# Patient Record
Sex: Female | Born: 1975
Health system: Southern US, Community
[De-identification: ages and names within clinical notes are randomized; demographics above are authoritative.]

## PROBLEM LIST (undated history)

## (undated) ENCOUNTER — Inpatient Hospital Stay (HOSPITAL_COMMUNITY): Payer: Self-pay

## (undated) DIAGNOSIS — E061 Subacute thyroiditis: Secondary | ICD-10-CM

## (undated) DIAGNOSIS — F419 Anxiety disorder, unspecified: Secondary | ICD-10-CM

## (undated) DIAGNOSIS — D649 Anemia, unspecified: Secondary | ICD-10-CM

## (undated) DIAGNOSIS — R112 Nausea with vomiting, unspecified: Secondary | ICD-10-CM

## (undated) DIAGNOSIS — F32A Depression, unspecified: Secondary | ICD-10-CM

## (undated) DIAGNOSIS — F329 Major depressive disorder, single episode, unspecified: Secondary | ICD-10-CM

## (undated) DIAGNOSIS — E0590 Thyrotoxicosis, unspecified without thyrotoxic crisis or storm: Secondary | ICD-10-CM

## (undated) DIAGNOSIS — R7611 Nonspecific reaction to tuberculin skin test without active tuberculosis: Secondary | ICD-10-CM

## (undated) DIAGNOSIS — Z9889 Other specified postprocedural states: Secondary | ICD-10-CM

## (undated) DIAGNOSIS — F410 Panic disorder [episodic paroxysmal anxiety] without agoraphobia: Secondary | ICD-10-CM

## (undated) DIAGNOSIS — E079 Disorder of thyroid, unspecified: Secondary | ICD-10-CM

## (undated) HISTORY — DX: Nonspecific reaction to tuberculin skin test without active tuberculosis: R76.11

## (undated) HISTORY — PX: LEEP: SHX91

## (undated) HISTORY — DX: Subacute thyroiditis: E06.1

## (undated) HISTORY — DX: Panic disorder (episodic paroxysmal anxiety): F41.0

## (undated) HISTORY — DX: Disorder of thyroid, unspecified: E07.9

---
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
## 1996-02-28 HISTORY — PX: OTHER SURGICAL HISTORY: SHX169

## 2001-10-23 ENCOUNTER — Emergency Department (HOSPITAL_COMMUNITY): Admission: EM | Admit: 2001-10-23 | Discharge: 2001-10-24 | Payer: Self-pay | Admitting: *Deleted

## 2005-02-16 ENCOUNTER — Other Ambulatory Visit: Admission: RE | Admit: 2005-02-16 | Discharge: 2005-02-16 | Payer: Self-pay | Admitting: Obstetrics and Gynecology

## 2007-02-28 DIAGNOSIS — E061 Subacute thyroiditis: Secondary | ICD-10-CM

## 2007-02-28 HISTORY — DX: Subacute thyroiditis: E06.1

## 2007-10-08 ENCOUNTER — Encounter: Admission: RE | Admit: 2007-10-08 | Discharge: 2007-10-08 | Payer: Self-pay | Admitting: Family Medicine

## 2008-01-16 ENCOUNTER — Encounter: Payer: Self-pay | Admitting: Family Medicine

## 2008-05-15 ENCOUNTER — Encounter: Payer: Self-pay | Admitting: Family Medicine

## 2008-05-15 LAB — CONVERTED CEMR LAB
Free T4: 0.84 ng/dL
TSH: 1.28 microintl units/mL

## 2009-05-28 ENCOUNTER — Encounter: Payer: Self-pay | Admitting: Family Medicine

## 2009-05-28 LAB — CONVERTED CEMR LAB: TSH: 1.33 microintl units/mL

## 2009-11-27 ENCOUNTER — Encounter: Payer: Self-pay | Admitting: Family Medicine

## 2010-03-31 ENCOUNTER — Ambulatory Visit: Payer: Self-pay | Admitting: Family Medicine

## 2010-04-01 ENCOUNTER — Encounter: Payer: Self-pay | Admitting: Family Medicine

## 2010-04-05 ENCOUNTER — Ambulatory Visit: Payer: Self-pay | Admitting: Family Medicine

## 2010-04-11 ENCOUNTER — Ambulatory Visit (INDEPENDENT_AMBULATORY_CARE_PROVIDER_SITE_OTHER): Payer: 59 | Admitting: Family Medicine

## 2010-04-11 ENCOUNTER — Encounter: Payer: Self-pay | Admitting: Family Medicine

## 2010-04-11 DIAGNOSIS — F410 Panic disorder [episodic paroxysmal anxiety] without agoraphobia: Secondary | ICD-10-CM | POA: Insufficient documentation

## 2010-04-11 DIAGNOSIS — E079 Disorder of thyroid, unspecified: Secondary | ICD-10-CM | POA: Insufficient documentation

## 2010-04-11 DIAGNOSIS — F411 Generalized anxiety disorder: Secondary | ICD-10-CM | POA: Insufficient documentation

## 2010-04-15 ENCOUNTER — Encounter: Payer: Self-pay | Admitting: Family Medicine

## 2010-04-18 ENCOUNTER — Ambulatory Visit: Payer: 59 | Admitting: Family Medicine

## 2010-04-18 ENCOUNTER — Encounter: Payer: Self-pay | Admitting: Family Medicine

## 2010-04-18 LAB — CONVERTED CEMR LAB: Pap Smear: ABNORMAL

## 2010-04-19 ENCOUNTER — Encounter: Payer: Self-pay | Admitting: Family Medicine
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
## 2010-04-20 NOTE — Miscellaneous (Signed)
  Clinical Lists Changes  Problems: Added new problem of PANIC DISORDER WITHOUT AGORAPHOBIA (ICD-300.01) Removed problem of PANIC ATTACK (ICD-300.01) Added new problem of ANXIETY DISORDER (ICD-300.00)

## 2010-04-26 ENCOUNTER — Encounter: Payer: Self-pay | Admitting: Family Medicine

## 2010-04-26 ENCOUNTER — Ambulatory Visit (INDEPENDENT_AMBULATORY_CARE_PROVIDER_SITE_OTHER): Payer: 59 | Admitting: Family Medicine

## 2010-04-26 DIAGNOSIS — F411 Generalized anxiety disorder: Secondary | ICD-10-CM

## 2010-04-26 NOTE — Letter (Signed)
Summary: [facility]   [facility]   Imported By: Kassie Mends 04/19/2010 08:46:35  _____________________________________________________________________  External Attachment:    Type:   Image     Comment:   External Document

## 2010-04-27 ENCOUNTER — Encounter: Payer: Self-pay | Admitting: Family Medicine

## 2010-04-28 ENCOUNTER — Telehealth: Payer: Self-pay | Admitting: Family Medicine

## 2010-05-02 ENCOUNTER — Telehealth: Payer: Self-pay | Admitting: Family Medicine

## 2010-05-05 NOTE — Miscellaneous (Signed)
  Clinical Lists Changes  Observations: Added new observation of PAST MED HX: * POSITIVE TB SKIN TEST-age 35, was treated with INH for 6 months PANIC ATTACK (ICD-300.01) THYROID DISORDER (ICD-246.9)- per Dr. Sharl Ma, no meds.   Gyn- Dr. Algie Coffer, G1 P0  Subacute thyroiditis - 2009 (04/27/2010 15:22) Added new observation of TSH: 1.33 microintl units/mL (05/28/2009 15:22) Added new observation of T4, FREE: 0.75 ng/dL (56/21/3086 57:84) Added new observation of TSH: 1.28 microintl units/mL (05/15/2008 15:22) Added new observation of T4, FREE: 0.84 ng/dL (69/62/9528 41:32) Added new observation of TSH: 2.53 microintl units/mL (01/16/2008 15:22) Added new observation of T4, FREE: 0.74 ng/dL (44/02/270 53:66)      Past History:  Past Medical History: * POSITIVE TB SKIN TEST-age 35, was treated with INH for 6 months PANIC ATTACK (ICD-300.01) THYROID DISORDER (ICD-246.9)- per Dr. Sharl Ma, no meds.   Gyn- Dr. Algie Coffer, G1 P0  Subacute thyroiditis - 2009

## 2010-05-05 NOTE — Progress Notes (Signed)
  Phone Note Outgoing Call   Summary of Call: I called patient.  she could continue B6 100mg  a day.  this may help her symptoms.  She has tried it one cycle.  I would give it more time.  If that doesn't work we can talk about other options.  She was thinking about accupuncture.  I am not aware of evidence for this but she'll look into it.  If her symptoms continue, we could consider a trial of another SSRI, ie low dose of prozac.  She'll notify us as needed.  Crawford Givens MD  April 28, 2010 3:12 PM

## 2010-05-05 NOTE — Assessment & Plan Note (Signed)
Summary: ANXIETY/CLE   [facility]   Vital Signs:  Patient profile:   35 year old female Height:      68 inches Weight:      250.50 pounds BMI:     38.23 Temp:     98.2 degrees F oral Pulse rate:   80 / minute Pulse rhythm:   regular BP sitting:   122 / 74  (left arm) Cuff size:   large  Vitals Entered By: [name] CMA Mary [name]) (April 26, 2010 3:26 PM) CC: Anxiety   History of Present Illness: Was trying to taper off the xanax.  She has had trouble with PMS symptoms. This is consistent before each menses.  She is asking about what can be done before each menses.  "I've always had trouble with PMS but I think it is getting worse."  Intolerant of zoloft prev.  Would like to try to get pregnant and isn't currently on OCPs.   goal for treatment is decrease in PMS symptoms wiht the least amount of medicine possible.  No SI/HI.   Allergies: 1)  ! Sulfa 2)  ! Penicillin 3)  ! Zoloft  Review of Systems       See HPI.  Otherwise negative.    Physical Exam  General:  no apparent distress pleasant in conversation mucous membranes moist regular rate and rhythm clear to auscultation bilaterally ext well perfused.    Impression & Recommendations:  Problem # 1:  ANXIETY DISORDER (ICD-300.00) I told her that I would like to consider several options and then get back in touch with her.  She agrees.  She was prev intolerant of zoloft. No change in meds in the meantime.  Continue with counseling.   Her updated medication list for this problem includes:    Alprazolam 0.25 Mg Tabs (Alprazolam) .Marland Kitchen... Take 1 tablet by mouth 4 to 6 times per day  Complete Medication List: 1)  Alprazolam 0.25 Mg Tabs (Alprazolam) .... Take 1 tablet by mouth 4 to 6 times per day 2)  B-1 High Potency 100 Mg Tabs (Thiamine hcl) .... Take 1 tablet by mouth once a day 3)  Fish Oil Oil (Fish oil) .Marland Kitchen.. 1200 mg. once daily   Patient Instructions: 1)  I'll call you with options.  Don't change your meds in the  meantime.    Orders Added: 1)  Est. Patient Level III [14782]    Current Allergies (reviewed today): ! SULFA ! PENICILLIN ! ZOLOFT

## 2010-05-05 NOTE — Miscellaneous (Signed)
  Clinical Lists Changes  Observations: Added new observation of PAP SMEAR: abnormal - LSIL (04/18/2010 16:02) Added new observation of TD BOOSTER: Tdap (09/17/2009 16:03)      Preventive Care Screening  Pap Smear:    Date:  04/18/2010    Results:  abnormal - LSIL    Immunization History:  Tetanus/Td Immunization History:    Tetanus/Td:  tdap (09/17/2009)

## 2010-05-09 ENCOUNTER — Telehealth: Payer: Self-pay | Admitting: Family Medicine

## 2010-05-10 ENCOUNTER — Ambulatory Visit: Payer: 59 | Admitting: Family Medicine

## 2010-05-10 NOTE — Progress Notes (Signed)
Summary: prozac   Phone Note Call from Patient   Caller: Patient Call For: Crawford Givens MD Summary of Call: Patient says that she has decided that she wants to try the prozac and  is asking if [facility] [address]. Initial call taken by: Melody Comas,  May 02, 2010 4:48 PM  Follow-up for Phone Call        please call in prozac 10mg  by mouth once daily.  #30, 2rf.  If patient has any adverse effect- increase in anxiety, worsening mood, etc, then stop the medicine and notify the clinic.  If she isn't having any trouble on the medicine, I still want her to call back with an update in 2 weeks.  thanks.  please update the med list.  Follow-up by: Crawford Givens MD,  May 02, 2010 5:15 PM  Additional Follow-up for Phone Call Additional follow up Details #1::        Patient Advised. Medication phoned to pharmacy.  Additional Follow-up by: [name] CMA (AAMA),  May 02, 2010 5:22 PM    New/Updated Medications: PROZAC 10 MG CAPS (FLUOXETINE HCL) Take 1 tablet by mouth once a day Prescriptions: PROZAC 10 MG CAPS (FLUOXETINE HCL) Take 1 tablet by mouth once a day  #30 x 2   Entered by:   [name] CMA (AAMA)   Authorized by:   Crawford Givens MD   Signed by:   [name] CMA (AAMA) on 05/02/2010   Method used:   Electronically to        [facility] [id number] ([facility]       [address]       [place], [place]  [address]       Ph: [phone] or [phone]       Fax: [phone]   RxID:   [id number]

## 2010-05-10 NOTE — Letter (Signed)
Summary: [facility]  [facility]   Imported By: Kassie Mends 05/05/2010 11:28:40  _____________________________________________________________________  External Attachment:    Type:   Image     Comment:   External Document

## 2010-05-10 NOTE — Letter (Signed)
Summary: [facility]   Imported By: Kassie Mends 05/05/2010 11:30:30  _____________________________________________________________________  External Attachment:    Type:   Image     Comment:   External Document

## 2010-05-17 NOTE — Progress Notes (Signed)
Summary: alprazolam   Phone Note Refill Request Message from:  Fax from Pharmacy on May 09, 2010 10:29 AM  Refills Requested: Medication #1:  ALPRAZOLAM 0.25 MG TABS Take 1 tablet by mouth 4 to 6 times per day   Last Refilled: 04/11/2010 [facility] from [facility] [address] [phone].  Initial call taken by: Melody Comas,  May 09, 2010 10:31 AM  Follow-up for Phone Call        please call in.  thanks. Crawford Givens MD  May 09, 2010 1:47 PM   Medication phoned to pharmacy. Lugene Fuquay CMA (AAMA)  May 09, 2010 2:28 PM     Prescriptions: ALPRAZOLAM 0.25 MG TABS (ALPRAZOLAM) Take 1 tablet by mouth 4 to 6 times per day  #130 x 0   Entered and Authorized by:   Crawford Givens MD   Signed by:   Crawford Givens MD on 05/09/2010   Method used:   Telephoned to ...       [facility] [address] [id number]       [address]       [place], [place]  [address]       Ph: [phone] or [phone]       Fax: [phone]   RxID:   [id number]

## 2010-05-26 ENCOUNTER — Telehealth: Payer: Self-pay | Admitting: *Deleted

## 2010-05-26 DIAGNOSIS — F32A Depression, unspecified: Secondary | ICD-10-CM

## 2010-05-26 DIAGNOSIS — F329 Major depressive disorder, single episode, unspecified: Secondary | ICD-10-CM

## 2010-05-26 MED ORDER — FLUOXETINE HCL 20 MG PO CAPS: 20.0000 mg | ORAL_CAPSULE | Freq: Every day | ORAL | Status: DC

## 2010-05-26 NOTE — Telephone Encounter (Signed)
Pt's therapist thinks that pt would benefit from an increased dose of prozac.  She is taking 10 mg's now, and pt is showing some improvement with that, but therapist thinks that she would do better with a higher dose. Wants to go to 20 mg's daily.  Please advise.  Pt's phone is [phone].

## 2010-05-26 NOTE — Telephone Encounter (Signed)
I would inc to 2 pills a day (total of 20mg  per day).  She can take both at the same time.  Please have her call with an update in ~ 2 weeks.  Thanks.

## 2010-05-26 NOTE — Telephone Encounter (Signed)
Patient advised.  New Rx. Sent to pharmacy.

## 2010-06-07 ENCOUNTER — Other Ambulatory Visit: Payer: Self-pay | Admitting: *Deleted

## 2010-06-08 MED ORDER — ALPRAZOLAM 0.25 MG PO TABS: ORAL_TABLET | ORAL | Status: DC

## 2010-06-08 NOTE — Telephone Encounter (Signed)
Please call in and please call patient to get an update on her symptoms, progress with therapy.  Thanks.

## 2010-06-08 NOTE — Telephone Encounter (Signed)
Patient says she is taking Prozac twice a day now and that seems to be helping.  She has cut back on the Xanax to about 3 to 3-1/2 per day and plans to taper off even more.  Medication phoned to pharmacy.

## 2010-06-08 NOTE — Telephone Encounter (Signed)
Noted  

## 2010-07-10 ENCOUNTER — Inpatient Hospital Stay (HOSPITAL_COMMUNITY)
Admission: AD | Admit: 2010-07-10 | Discharge: 2010-07-10 | Disposition: A | Discharge status: Home / Self Care | Payer: 59 | Source: Ambulatory Visit | Attending: Obstetrics & Gynecology | Admitting: Obstetrics & Gynecology

## 2010-07-10 DIAGNOSIS — N920 Excessive and frequent menstruation with regular cycle: Secondary | ICD-10-CM | POA: Insufficient documentation

## 2010-07-14 ENCOUNTER — Other Ambulatory Visit: Payer: Self-pay | Admitting: *Deleted

## 2010-07-15 MED ORDER — ALPRAZOLAM 0.25 MG PO TABS: ORAL_TABLET | ORAL | Status: DC

## 2010-07-15 NOTE — Telephone Encounter (Signed)
plz phone in and notify pt. 

## 2010-07-15 NOTE — Telephone Encounter (Signed)
Rx called in as directed.   

## 2010-07-28 ENCOUNTER — Telehealth: Payer: Self-pay | Admitting: Family Medicine

## 2010-07-28 DIAGNOSIS — F32A Depression, unspecified: Secondary | ICD-10-CM

## 2010-07-28 DIAGNOSIS — F329 Major depressive disorder, single episode, unspecified: Secondary | ICD-10-CM

## 2010-07-28 MED ORDER — FLUOXETINE HCL 40 MG PO CAPS: 40.0000 mg | ORAL_CAPSULE | Freq: Every day | ORAL | Status: DC

## 2010-07-28 NOTE — Telephone Encounter (Signed)
Please call pt.  Have her use the current prozac rx, but inc to a total of 30mg  a day for 1 week.  She can inc to 40mg  a day after that.  I sent in new rx for prozac 40mg .  Have her call back with an update on her condition in about 3-4 weeks, sooner if needed.  Thanks.

## 2010-07-29 NOTE — Telephone Encounter (Signed)
Patient notified and will call with an update.

## 2010-08-09 ENCOUNTER — Telehealth: Payer: Self-pay | Admitting: *Deleted

## 2010-08-09 NOTE — Telephone Encounter (Signed)
Pt's prozac dose was increased from 30 to 40 mg's and the increase is making her feel overly anxious.  She wants to go back down to 30 mg's and will need a new script sent to [facility] [address].  I advised pt that you are out of the office today.  She has enough to last a few more days.

## 2010-08-10 MED ORDER — FLUOXETINE HCL 10 MG PO TABS: 30.0000 mg | ORAL_TABLET | Freq: Every day | ORAL | Status: DC

## 2010-08-10 NOTE — Telephone Encounter (Signed)
Left message on voice mail  to call back

## 2010-08-10 NOTE — Telephone Encounter (Signed)
Rx was sent.  Please notify pt and then have her call back with update in 1-2 weeks, sooner if needed.  Thanks.

## 2010-08-10 NOTE — Telephone Encounter (Signed)
Patient notified as instructed by telephone. 

## 2010-08-15 ENCOUNTER — Other Ambulatory Visit: Payer: Self-pay | Admitting: *Deleted

## 2010-08-15 MED ORDER — ALPRAZOLAM 0.25 MG PO TABS: ORAL_TABLET | ORAL | Status: DC

## 2010-08-15 NOTE — Telephone Encounter (Signed)
Please call in

## 2010-08-15 NOTE — Telephone Encounter (Signed)
Rx called to pharmacy.  Patient notified via telephone.

## 2010-08-28 ENCOUNTER — Encounter: Payer: Self-pay | Admitting: Family Medicine

## 2010-09-13 ENCOUNTER — Other Ambulatory Visit: Payer: Self-pay | Admitting: *Deleted

## 2010-09-13 MED ORDER — ALPRAZOLAM 0.25 MG PO TABS: ORAL_TABLET | ORAL | Status: DC

## 2010-09-13 NOTE — Telephone Encounter (Signed)
Faxed request from [facility] [address], request is for # [phone].  Last filled 08/15/10.

## 2010-09-13 NOTE — Telephone Encounter (Signed)
Please call in

## 2010-09-14 ENCOUNTER — Other Ambulatory Visit: Payer: Self-pay | Admitting: *Deleted

## 2010-09-14 NOTE — Telephone Encounter (Signed)
Should have been called in yesterday, please check the prev records.

## 2010-09-14 NOTE — Telephone Encounter (Signed)
My error. It was taken care of. 

## 2010-09-14 NOTE — Telephone Encounter (Signed)
Okay to refill? 

## 2010-09-14 NOTE — Telephone Encounter (Signed)
Rx called in as directed.   

## 2010-10-17 ENCOUNTER — Other Ambulatory Visit: Payer: Self-pay | Admitting: *Deleted

## 2010-10-17 MED ORDER — ALPRAZOLAM 0.25 MG PO TABS: ORAL_TABLET | ORAL | Status: DC

## 2010-10-17 NOTE — Telephone Encounter (Signed)
Medication phoned to pharmacy.  Patient advised to schedule appt.

## 2010-10-17 NOTE — Telephone Encounter (Signed)
Please call in.  Please have her schedule a visit for the fall.  Thanks.

## 2010-11-14 ENCOUNTER — Ambulatory Visit: Payer: 59 | Admitting: Family Medicine

## 2010-11-18 ENCOUNTER — Encounter: Payer: Self-pay | Admitting: Family Medicine

## 2010-11-22 ENCOUNTER — Ambulatory Visit: Payer: 59 | Admitting: Family Medicine

## 2010-11-25 ENCOUNTER — Ambulatory Visit: Payer: 59 | Admitting: Family Medicine

## 2010-11-28 ENCOUNTER — Encounter: Payer: Self-pay | Admitting: Family Medicine

## 2010-11-28 ENCOUNTER — Ambulatory Visit (INDEPENDENT_AMBULATORY_CARE_PROVIDER_SITE_OTHER): Payer: BC Managed Care – PPO | Admitting: Family Medicine

## 2010-11-28 VITALS — BP 108/76 | HR 87 | Temp 98.4°F | Wt 247.1 lb

## 2010-11-28 DIAGNOSIS — S61219A Laceration without foreign body of unspecified finger without damage to nail, initial encounter: Secondary | ICD-10-CM

## 2010-11-28 DIAGNOSIS — F410 Panic disorder [episodic paroxysmal anxiety] without agoraphobia: Secondary | ICD-10-CM

## 2010-11-28 DIAGNOSIS — S61209A Unspecified open wound of unspecified finger without damage to nail, initial encounter: Secondary | ICD-10-CM

## 2010-11-28 MED ORDER — ALPRAZOLAM 0.25 MG PO TABS: 0.2500 mg | ORAL_TABLET | Freq: Two times a day (BID) | ORAL | Status: DC

## 2010-11-28 MED ORDER — FLUOXETINE HCL 10 MG PO CAPS: 30.0000 mg | ORAL_CAPSULE | Freq: Every day | ORAL | Status: DC

## 2010-11-28 NOTE — Progress Notes (Signed)
"  I feel much better.  The prozac really helped."  She has been on less xanax, now down to 2 a day.  Still in counseling with Dr. Leodis Binet.  She got rid of the old job and this helped a lot.  School full time at [employer], photography classes.  Doing well at home.  No SI/HI.  30mg  of prozac.  Still with relative inc in anxiety the week before her menses.  Exercising less with school; we talked about this today.  Sleep is okay.  She is motivated.    Flu shot declined by patient.    Recent laceration on L thumb, cleaned and bandaged.  Tetanus up to date.   Meds, vitals, and allergies reviewed.   ROS: See HPI.  Otherwise, noncontributory.  nad ncat rrr ctab Affect, speech, judgement wnl L thumb with healing distal laceration that doesn't involve the nailbed.  Good tissue approximation and it appears distally vascularly intact.  No erythema.  Re- bandage.

## 2010-11-28 NOTE — Assessment & Plan Note (Signed)
Appears to be healing well.  She may lose the flap eventually, but it's small and should still heal well.  D/w pt, she'll f/u prn.

## 2010-11-28 NOTE — Patient Instructions (Signed)
Continue to slowly taper off the xanax and continue with counseling.  I would talk with your OBGYN before pregnancy about your meds.  Let me know if you have concerns or if you can't taper off the xanax fully.  Take care.

## 2010-11-28 NOTE — Assessment & Plan Note (Addendum)
I think it would be okay to take prozac during pregnancy given the history of her symptoms, but it would good to get off xanax before conception.  She hopes to be off BZD by 12/12.  Continue with slow BZD taper for now and continue SSRI.  She appears to be doing well.  >25 min spent with face to face with patient, >50% counseling.

## 2011-02-27 ENCOUNTER — Other Ambulatory Visit: Payer: Self-pay | Admitting: Family Medicine

## 2011-02-27 ENCOUNTER — Other Ambulatory Visit: Payer: Self-pay | Admitting: *Deleted

## 2011-02-27 MED ORDER — FLUOXETINE HCL 10 MG PO CAPS: 30.0000 mg | ORAL_CAPSULE | Freq: Every day | ORAL | Status: DC

## 2011-03-01 ENCOUNTER — Other Ambulatory Visit: Payer: Self-pay | Admitting: *Deleted

## 2011-03-01 MED ORDER — ALPRAZOLAM 0.25 MG PO TABS: 0.2500 mg | ORAL_TABLET | Freq: Two times a day (BID) | ORAL | Status: DC

## 2011-03-01 NOTE — Telephone Encounter (Signed)
Please call in the alprazolam and talk to me about the prozac rx.  Thanks.

## 2011-03-01 NOTE — Telephone Encounter (Signed)
Patient's states she takes 40mg  of the Prozac for 3 months and 10mg (3 tabs daily= 30mg ) was faxed to her pharmacy

## 2011-03-01 NOTE — Telephone Encounter (Signed)
Faxed RF request.

## 2011-03-02 MED ORDER — FLUOXETINE HCL 10 MG PO CAPS: ORAL_CAPSULE | ORAL | Status: DC

## 2011-03-02 NOTE — Telephone Encounter (Addendum)
Medication phoned to pharmacy.  Patient contacted and she states that she was able to build up to 40 mg in an effort to lower her dosage of Alprazolam.  Dosage updated on meds list.

## 2011-09-04 ENCOUNTER — Other Ambulatory Visit: Payer: Self-pay | Admitting: *Deleted

## 2011-09-04 NOTE — Telephone Encounter (Signed)
Received faxed refill request from pharmacy for Fluoxetine 40 mg, take one capsule by mouth every day. This does not match the medication sheet, but total amount taken daily is correct.  Last office visit 11/28/10. Is it okay to refill medication?

## 2011-09-04 NOTE — Telephone Encounter (Signed)
Verify dose with patient and then then send 90 day supply with 1 rf.  Thanks.

## 2011-09-05 NOTE — Telephone Encounter (Signed)
Left message on machine to call back  

## 2011-09-05 NOTE — Telephone Encounter (Signed)
Pt called back, placed pt on hold to answer Dr Perrin's question and when I picked up pt had hung up. Left v/m for pt to call back.

## 2011-09-06 MED ORDER — FLUOXETINE HCL 40 MG PO CAPS: 40.0000 mg | ORAL_CAPSULE | Freq: Every day | ORAL | Status: DC

## 2011-09-06 NOTE — Telephone Encounter (Signed)
Spoke to patient and was advised that she is taking Fluoxetine 40 mg, once daily. Rx sent to pharmacy as instructed.

## 2011-12-07 ENCOUNTER — Other Ambulatory Visit: Payer: BC Managed Care – PPO

## 2011-12-12 LAB — OB RESULTS CONSOLE ANTIBODY SCREEN: Antibody Screen: NEGATIVE

## 2011-12-15 ENCOUNTER — Encounter: Payer: Self-pay | Admitting: Family Medicine

## 2011-12-15 ENCOUNTER — Ambulatory Visit (INDEPENDENT_AMBULATORY_CARE_PROVIDER_SITE_OTHER): Payer: BC Managed Care – PPO | Admitting: Family Medicine

## 2011-12-15 VITALS — BP 114/70 | HR 89 | Temp 98.8°F | Wt 268.8 lb

## 2011-12-15 DIAGNOSIS — F411 Generalized anxiety disorder: Secondary | ICD-10-CM

## 2011-12-15 DIAGNOSIS — E079 Disorder of thyroid, unspecified: Secondary | ICD-10-CM

## 2011-12-15 DIAGNOSIS — Z0000 Encounter for general adult medical examination without abnormal findings: Secondary | ICD-10-CM

## 2011-12-15 DIAGNOSIS — Z3490 Encounter for supervision of normal pregnancy, unspecified, unspecified trimester: Secondary | ICD-10-CM

## 2011-12-15 DIAGNOSIS — Z331 Pregnant state, incidental: Secondary | ICD-10-CM

## 2011-12-15 MED ORDER — FLUOXETINE HCL 40 MG PO CAPS: 40.0000 mg | ORAL_CAPSULE | Freq: Every day | ORAL | Status: DC

## 2011-12-15 MED ORDER — FLUOXETINE HCL 10 MG PO CAPS: 10.0000 mg | ORAL_CAPSULE | Freq: Every day | ORAL | Status: DC

## 2011-12-15 NOTE — Patient Instructions (Signed)
Let me know if you have concerns.   I would try to start waking at an easy pace.   I'll await your labs from the [facility] clinic.

## 2011-12-16 ENCOUNTER — Encounter: Payer: Self-pay | Admitting: Family Medicine

## 2011-12-16 DIAGNOSIS — Z3490 Encounter for supervision of normal pregnancy, unspecified, unspecified trimester: Secondary | ICD-10-CM | POA: Insufficient documentation

## 2011-12-16 DIAGNOSIS — Z131 Encounter for screening for diabetes mellitus: Secondary | ICD-10-CM | POA: Insufficient documentation

## 2011-12-16 NOTE — Progress Notes (Signed)
CPE- See plan.  Routine anticipatory guidance given to patient.  See health maintenance. Flu shot done 4 days ago.  Tetanus 2011 Diet and exercise discussed Pap per OBGYN Routine anticipatory guidance given.   Pregnant, G3P0.  Had a miscarriage earlier this year, kept up with counseling at that point.  Has f/u with OB, recently with pap and labs drawn.  [redacted] weeks pregnant.  Some AM sickness, resolving.  D/w pt about modest/moderate exercise during pregnancy.  She has talked with OB about all of her meds, they approve per patient.  No VB, ctx, FM, LOF.  She'll deliver at [facility].  We discussed pregnancy plans, possible postpartum mood changes, etc.    H/o thyroiditis.  TSH reported wnl at [facility] office earlier this week.  Awaiting records.  No neck pain, mass, dysphagia.    Anxiety- controlled with current meds.  Not needing BZD on 50mg  of prozac.  No Si/Hi.  Safe at home, husband supportive, doing well at work (had changed jobs).  Still in counseling.   PMH and SH reviewed  Meds, vitals, and allergies reviewed.   ROS: See HPI.  Otherwise negative.    GEN: nad, alert and oriented HEENT: mucous membranes moist NECK: supple w/o LA, no TMG or tenderness CV: rrr. PULM: ctab, no inc wob ABD: soft, +bs EXT: no edema SKIN: no acute rash

## 2011-12-16 NOTE — Assessment & Plan Note (Signed)
Routine care, diet/exercise, folate, and postpartum discussion. Doing well.  Will defer to [facility] clinic.

## 2011-12-16 NOTE — Assessment & Plan Note (Signed)
Controlled with 50mg  prozac, doing well.  Still in counseling.  Affect bright and doing well with pregnancy.  We discussed post partum changes, and she'll be in touch prn, will continue with counseling.

## 2011-12-16 NOTE — Assessment & Plan Note (Signed)
With TSH recently wnl per patient and awaiting records. No goiter on exam.

## 2011-12-16 NOTE — Assessment & Plan Note (Signed)
Routine anticipatory guidance given to patient.  See health maintenance. Flu shot done 4 days ago.  Tetanus 2011 Diet and exercise discussed Pap per OBGYN Routine anticipatory guidance given.

## 2011-12-21 LAB — OB RESULTS CONSOLE GC/CHLAMYDIA: Gonorrhea: NEGATIVE

## 2011-12-25 ENCOUNTER — Encounter (HOSPITAL_COMMUNITY): Payer: Self-pay | Admitting: *Deleted

## 2011-12-25 ENCOUNTER — Inpatient Hospital Stay (HOSPITAL_COMMUNITY): Payer: BC Managed Care – PPO

## 2011-12-25 ENCOUNTER — Inpatient Hospital Stay (HOSPITAL_COMMUNITY)
Admission: AD | Admit: 2011-12-25 | Discharge: 2011-12-25 | Disposition: A | Discharge status: Home / Self Care | Payer: BC Managed Care – PPO | Source: Ambulatory Visit | Attending: Obstetrics & Gynecology | Admitting: Obstetrics & Gynecology

## 2011-12-25 DIAGNOSIS — O200 Threatened abortion: Secondary | ICD-10-CM | POA: Insufficient documentation

## 2011-12-25 DIAGNOSIS — O209 Hemorrhage in early pregnancy, unspecified: Secondary | ICD-10-CM

## 2011-12-25 HISTORY — DX: Other specified postprocedural states: Z98.890

## 2011-12-25 HISTORY — DX: Other specified postprocedural states: R11.2

## 2011-12-25 MED ORDER — HYDROCODONE-ACETAMINOPHEN 5-325 MG PO TABS
1.0000 | ORAL_TABLET | ORAL | Status: DC | PRN
  Administered 2011-12-25: 1 via ORAL
  Filled 2011-12-25: qty 1

## 2011-12-25 NOTE — MAU Note (Signed)
Pt reports started having bright red bleeding x 45 minutes, states did not having any pain with the bleeding. States she has had a "really bad headache" all day, also reports her left arm was hurting last pm and that this happens when she is anxious so she took a xanax but it did not seem to help like usual.
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
## 2011-12-26 ENCOUNTER — Encounter: Payer: Self-pay | Admitting: Family Medicine

## 2012-02-06 ENCOUNTER — Ambulatory Visit: Payer: BC Managed Care – PPO | Admitting: Family Medicine

## 2012-02-28 NOTE — L&D Delivery Note (Signed)
Operative Delivery Note At 2:19 AM a viable female was delivered via Vaginal, Vacuum (Extractor)- KIWI.  Presentation: vertex; Position: Right,, Occiput,, Anterior; Station: +2.  Verbal consent: obtained from patient.  Risks and benefits discussed in detail.  Risks include, but are not limited to the risks of anesthesia, bleeding, infection, damage to maternal tissues, fetal cephalhematoma, ICH.  There is also the risk of inability to effect vaginal delivery of the head, or shoulder dystocia that cannot be resolved by established maneuvers, leading to the need for emergency cesarean section. Decision made to proceed with attempt at vacuum and only continue if baby is moving down appropriately. Indications for VAVD is unsure fetal status. IUPC and FSE in place but fetal heart tones hypervariable, many times recording in the low 90's. Audible FH irregular and bradycardic. Pt had a similar pattern w/ hypervariable FH and prolonged decles about 3 hrs prior, which improved after terbutaline. Since then, pt allowed passive descent and 30 min pushing prior to vacuum attempt. Pt making progress in descent and pushing with good effort.   KIWI vacuum applied after full consent. Continuous downward traction over 3 pushes with single contraction and pop off. Some movement but retraction of head after contraction over. Decision made to try one additional attempt. KIWI removed between contraction. With next contraction, over 3 pushes and with continued down traction/ guidance, delivery of fetal head. KIWI removed, shoulders followed easily. Baby to peds after cord cut.   APGAR: 7, 8; weight .  pending Placenta status: ,intact .   Cord: 3 vessels with the following complications: .  Cord pH: 7.36  Anesthesia: Epidural  Instruments: KIWI Episiotomy: None Lacerations: 2nd degree;Periurethral Suture Repair: 3.0 vicryl rapide Est. Blood Loss (mL): 650  Mom to postpartum.  Baby to nursery-stable.  Evee Liska  A. 06/29/2012, 2:45 AM

## 2012-04-03 ENCOUNTER — Encounter (HOSPITAL_COMMUNITY): Payer: Self-pay | Admitting: *Deleted

## 2012-04-03 DIAGNOSIS — R059 Cough, unspecified: Secondary | ICD-10-CM | POA: Insufficient documentation

## 2012-04-03 DIAGNOSIS — R05 Cough: Secondary | ICD-10-CM | POA: Insufficient documentation

## 2012-04-03 DIAGNOSIS — O9989 Other specified diseases and conditions complicating pregnancy, childbirth and the puerperium: Secondary | ICD-10-CM | POA: Insufficient documentation

## 2012-04-03 DIAGNOSIS — R5381 Other malaise: Secondary | ICD-10-CM | POA: Insufficient documentation

## 2012-04-03 DIAGNOSIS — R5383 Other fatigue: Secondary | ICD-10-CM | POA: Insufficient documentation

## 2012-04-03 DIAGNOSIS — R509 Fever, unspecified: Secondary | ICD-10-CM | POA: Insufficient documentation

## 2012-04-03 NOTE — ED Notes (Signed)
Pt called to update vital signs, no answer X 2.

## 2012-04-03 NOTE — ED Notes (Signed)
Pt reports cold/flu like symptoms since this past Saturday. Reports productive cough with yellow sputum, chills, fever of 99.5, fatigue. Patient is [redacted] weeks pregnant and came for further evaluation due to being pregnant. Pt has taken tylenol for the pain and fever. Last dose was this am around 8am. Pt is afebrile at this time.

## 2012-04-04 ENCOUNTER — Telehealth: Payer: Self-pay | Admitting: Family Medicine

## 2012-04-04 ENCOUNTER — Encounter: Payer: Self-pay | Admitting: Family Medicine

## 2012-04-04 ENCOUNTER — Emergency Department (HOSPITAL_COMMUNITY)
Admission: EM | Admit: 2012-04-04 | Discharge: 2012-04-04 | Discharge status: Against Medical Advice | Payer: BC Managed Care – PPO | Attending: Emergency Medicine | Admitting: Emergency Medicine

## 2012-04-04 ENCOUNTER — Ambulatory Visit (INDEPENDENT_AMBULATORY_CARE_PROVIDER_SITE_OTHER): Payer: BC Managed Care – PPO | Admitting: Family Medicine

## 2012-04-04 VITALS — BP 118/72 | HR 96 | Temp 98.1°F | Wt 281.8 lb

## 2012-04-04 DIAGNOSIS — R05 Cough: Secondary | ICD-10-CM

## 2012-04-04 DIAGNOSIS — J069 Acute upper respiratory infection, unspecified: Secondary | ICD-10-CM

## 2012-04-04 DIAGNOSIS — R059 Cough, unspecified: Secondary | ICD-10-CM

## 2012-04-04 HISTORY — DX: Anxiety disorder, unspecified: F41.9

## 2012-04-04 LAB — POCT INFLUENZA A/B
Influenza A, POC: NEGATIVE
Influenza B, POC: NEGATIVE

## 2012-04-04 MED ORDER — AZITHROMYCIN 250 MG PO TABS: ORAL_TABLET | ORAL | Status: DC

## 2012-04-04 NOTE — Progress Notes (Signed)
Pregnant, no contractions.  26 weeks. +FM.  No LOF, no VB now (noted early in pregnancy, had been seen by OB GYN at the time and resolved).  Morning sickness is resolved now.  Safe at home.    Sx started about 1 week ago.  Sick contacts at work.  Had a flu shot prev (~9/13).  She had throat sx initially, then cough.  Then had more head congestion.  Cough is productive.  Waking from the cough.  No fevers, tmax 99.7.  No vomiting. Last night she had a change in her breathing (possibly from nasal congestion and inc in abdomen size during the pregnancy).  She's had some occ bloody nasal discharge.  She was worried about catching something else at the ER last night and went home.  Here today for eval.  Meds, vitals, and allergies reviewed.   ROS: See HPI.  Otherwise, noncontributory.  GEN: nad, alert and oriented HEENT: mucous membranes moist, tm w/o erythema, nasal exam w/o erythema, clear discharge noted,  OP with cobblestoning, sinuses not ttp x4 NECK: supple w/o LA CV: rrr.   PULM: ctab, no inc wob EXT: no edema SKIN: no acute rash

## 2012-04-04 NOTE — Telephone Encounter (Signed)
Call pt, get update on condition.  See MAU notes.

## 2012-04-04 NOTE — Telephone Encounter (Signed)
Patient scheduled for appt today

## 2012-04-04 NOTE — ED Notes (Signed)
Pt called for room X2. No answer.

## 2012-04-04 NOTE — Patient Instructions (Addendum)
Hold onto the antibiotics for now.  Start them if your symptoms persist longer than 7 days.

## 2012-04-05 ENCOUNTER — Encounter: Payer: Self-pay | Admitting: *Deleted

## 2012-04-05 DIAGNOSIS — J069 Acute upper respiratory infection, unspecified: Secondary | ICD-10-CM | POA: Insufficient documentation

## 2012-04-05 NOTE — Assessment & Plan Note (Signed)
Nontoxic, likely viral.  Flu neg.  She has more sinus pressure than pain.  We talked about options.  If she continues to have symptoms lasting more than 1 week without improvement, then it would be reasonable to put her on abx.  zmax is a B for pregnancy.  She agrees.  Will notify OB clinic as a FYI.  I doubt she'll need the abx.

## 2012-04-22 LAB — OB RESULTS CONSOLE RPR: RPR: NONREACTIVE

## 2012-06-06 ENCOUNTER — Encounter (HOSPITAL_COMMUNITY): Payer: Self-pay | Admitting: *Deleted

## 2012-06-06 ENCOUNTER — Inpatient Hospital Stay (HOSPITAL_COMMUNITY)
Admission: AD | Admit: 2012-06-06 | Discharge: 2012-06-06 | Disposition: A | Discharge status: Home / Self Care | Payer: BC Managed Care – PPO | Source: Ambulatory Visit | Attending: Obstetrics | Admitting: Obstetrics

## 2012-06-06 DIAGNOSIS — O4700 False labor before 37 completed weeks of gestation, unspecified trimester: Secondary | ICD-10-CM | POA: Insufficient documentation

## 2012-06-06 DIAGNOSIS — O99891 Other specified diseases and conditions complicating pregnancy: Secondary | ICD-10-CM | POA: Insufficient documentation

## 2012-06-06 LAB — POCT FERN TEST: POCT Fern Test: NEGATIVE
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
## 2012-06-06 NOTE — MAU Note (Signed)
Leaking clear fluid since 4 pm this afternoon. Now just feels like her underwear is wet, but states doesn't smell like urine. Denies vaginal bleeding. Positive fetal movement. Some braxton hicks contractions.

## 2012-06-13 LAB — OB RESULTS CONSOLE GBS: GBS: NEGATIVE

## 2012-06-27 ENCOUNTER — Other Ambulatory Visit: Payer: Self-pay | Admitting: Obstetrics

## 2012-06-28 ENCOUNTER — Encounter (HOSPITAL_COMMUNITY): Payer: Self-pay

## 2012-06-28 ENCOUNTER — Encounter (HOSPITAL_COMMUNITY): Payer: Self-pay | Admitting: Anesthesiology

## 2012-06-28 ENCOUNTER — Inpatient Hospital Stay (HOSPITAL_COMMUNITY)
Admission: AD | Admit: 2012-06-28 | Discharge: 2012-06-30 | DRG: 373 | Disposition: A | Discharge status: Home / Self Care | Payer: BC Managed Care – PPO | Source: Ambulatory Visit | Attending: Obstetrics | Admitting: Obstetrics

## 2012-06-28 ENCOUNTER — Inpatient Hospital Stay (HOSPITAL_COMMUNITY): Payer: BC Managed Care – PPO | Admitting: Anesthesiology

## 2012-06-28 DIAGNOSIS — O34599 Maternal care for other abnormalities of gravid uterus, unspecified trimester: Secondary | ICD-10-CM | POA: Diagnosis present

## 2012-06-28 DIAGNOSIS — D4959 Neoplasm of unspecified behavior of other genitourinary organ: Secondary | ICD-10-CM | POA: Diagnosis present

## 2012-06-28 DIAGNOSIS — O09529 Supervision of elderly multigravida, unspecified trimester: Secondary | ICD-10-CM | POA: Diagnosis present

## 2012-06-28 DIAGNOSIS — O4100X0 Oligohydramnios, unspecified trimester, not applicable or unspecified: Secondary | ICD-10-CM | POA: Diagnosis present

## 2012-06-28 DIAGNOSIS — D259 Leiomyoma of uterus, unspecified: Secondary | ICD-10-CM | POA: Diagnosis present

## 2012-06-28 DIAGNOSIS — D62 Acute posthemorrhagic anemia: Secondary | ICD-10-CM | POA: Diagnosis not present

## 2012-06-28 DIAGNOSIS — O9903 Anemia complicating the puerperium: Secondary | ICD-10-CM | POA: Diagnosis not present

## 2012-06-28 LAB — CBC
HCT: 32.9 % — ABNORMAL LOW (ref 36.0–46.0)
Hemoglobin: 11.1 g/dL — ABNORMAL LOW (ref 12.0–15.0)
MCH: 28.7 pg (ref 26.0–34.0)
MCHC: 33.7 g/dL (ref 30.0–36.0)
MCV: 85 fL (ref 78.0–100.0)
RBC: 3.87 MIL/uL (ref 3.87–5.11)

## 2012-06-28 LAB — OB RESULTS CONSOLE HIV ANTIBODY (ROUTINE TESTING): HIV: NONREACTIVE

## 2012-06-28 LAB — COMPREHENSIVE METABOLIC PANEL
ALT: 10 U/L (ref 0–35)
AST: 16 U/L (ref 0–37)
Albumin: 2.5 g/dL — ABNORMAL LOW (ref 3.5–5.2)
CO2: 20 mEq/L (ref 19–32)
Calcium: 10.4 mg/dL (ref 8.4–10.5)
Creatinine, Ser: 0.62 mg/dL (ref 0.50–1.10)
Sodium: 129 mEq/L — ABNORMAL LOW (ref 135–145)
Total Protein: 6.1 g/dL (ref 6.0–8.3)

## 2012-06-28 LAB — OB RESULTS CONSOLE HEPATITIS B SURFACE ANTIGEN: Hepatitis B Surface Ag: NEGATIVE

## 2012-06-28 LAB — ABO/RH: ABO/RH(D): O POS

## 2012-06-28 MED ORDER — EPHEDRINE 5 MG/ML INJ
10.0000 mg | INTRAVENOUS | Status: DC | PRN
  Filled 2012-06-28: qty 4
  Filled 2012-06-28: qty 2

## 2012-06-28 MED ORDER — OXYTOCIN BOLUS FROM INFUSION: 500.0000 mL | INTRAVENOUS | Status: DC

## 2012-06-28 MED ORDER — ACETAMINOPHEN 325 MG PO TABS: 650.0000 mg | ORAL_TABLET | ORAL | Status: DC | PRN

## 2012-06-28 MED ORDER — DIPHENHYDRAMINE HCL 50 MG/ML IJ SOLN
12.5000 mg | INTRAMUSCULAR | Status: DC | PRN
Start: 2012-06-28 — End: 2012-06-29

## 2012-06-28 MED ORDER — IBUPROFEN 600 MG PO TABS: 600.0000 mg | ORAL_TABLET | Freq: Four times a day (QID) | ORAL | Status: DC | PRN

## 2012-06-28 MED ORDER — ZOLPIDEM TARTRATE 5 MG PO TABS: 5.0000 mg | ORAL_TABLET | Freq: Every evening | ORAL | Status: DC | PRN

## 2012-06-28 MED ORDER — OXYTOCIN 40 UNITS IN LACTATED RINGERS INFUSION - SIMPLE MED: 1.0000 m[IU]/min | INTRAVENOUS | Status: DC

## 2012-06-28 MED ORDER — EPHEDRINE 5 MG/ML INJ
10.0000 mg | INTRAVENOUS | Status: DC | PRN
  Filled 2012-06-28: qty 2

## 2012-06-28 MED ORDER — FENTANYL 2.5 MCG/ML BUPIVACAINE 1/10 % EPIDURAL INFUSION (WH - ANES)
14.0000 mL/h | INTRAMUSCULAR | Status: DC | PRN
  Administered 2012-06-28: 14 mL/h via EPIDURAL
  Filled 2012-06-28: qty 125

## 2012-06-28 MED ORDER — PHENYLEPHRINE 40 MCG/ML (10ML) SYRINGE FOR IV PUSH (FOR BLOOD PRESSURE SUPPORT)
80.0000 ug | PREFILLED_SYRINGE | INTRAVENOUS | Status: DC | PRN
  Filled 2012-06-28: qty 2

## 2012-06-28 MED ORDER — LACTATED RINGERS IV SOLN
500.0000 mL | INTRAVENOUS | Status: DC | PRN
  Administered 2012-06-28: 1000 mL via INTRAVENOUS

## 2012-06-28 MED ORDER — MISOPROSTOL 25 MCG QUARTER TABLET: 25.0000 ug | ORAL_TABLET | ORAL | Status: DC | PRN

## 2012-06-28 MED ORDER — CITRIC ACID-SODIUM CITRATE 334-500 MG/5ML PO SOLN: 30.0000 mL | ORAL | Status: DC | PRN

## 2012-06-28 MED ORDER — LACTATED RINGERS IV SOLN
INTRAVENOUS | Status: DC
  Administered 2012-06-28: 16:00:00 via INTRAVENOUS

## 2012-06-28 MED ORDER — TERBUTALINE SULFATE 1 MG/ML IJ SOLN
INTRAMUSCULAR | Status: AC
  Administered 2012-06-28: 0.25 mg via SUBCUTANEOUS
  Filled 2012-06-28: qty 1

## 2012-06-28 MED ORDER — PHENYLEPHRINE 40 MCG/ML (10ML) SYRINGE FOR IV PUSH (FOR BLOOD PRESSURE SUPPORT)
80.0000 ug | PREFILLED_SYRINGE | INTRAVENOUS | Status: DC | PRN
  Administered 2012-06-28: 80 ug via INTRAVENOUS
  Filled 2012-06-28 (×2): qty 5
  Filled 2012-06-28: qty 2

## 2012-06-28 MED ORDER — ONDANSETRON HCL 4 MG/2ML IJ SOLN: 4.0000 mg | Freq: Four times a day (QID) | INTRAMUSCULAR | Status: DC | PRN

## 2012-06-28 MED ORDER — LIDOCAINE HCL (PF) 1 % IJ SOLN
30.0000 mL | INTRAMUSCULAR | Status: DC | PRN
  Filled 2012-06-28 (×2): qty 30

## 2012-06-28 MED ORDER — TERBUTALINE SULFATE 1 MG/ML IJ SOLN: 0.2500 mg | Freq: Once | INTRAMUSCULAR | Status: DC | PRN

## 2012-06-28 MED ORDER — LIDOCAINE HCL (PF) 1 % IJ SOLN
INTRAMUSCULAR | Status: DC | PRN
  Administered 2012-06-28 (×4): 4 mL

## 2012-06-28 MED ORDER — LACTATED RINGERS IV SOLN
500.0000 mL | Freq: Once | INTRAVENOUS | Status: AC
  Administered 2012-06-28: 500 mL via INTRAVENOUS

## 2012-06-28 MED ORDER — OXYTOCIN 40 UNITS IN LACTATED RINGERS INFUSION - SIMPLE MED
62.5000 mL/h | INTRAVENOUS | Status: DC
  Filled 2012-06-28: qty 1000

## 2012-06-28 MED ORDER — OXYCODONE-ACETAMINOPHEN 5-325 MG PO TABS: 1.0000 | ORAL_TABLET | ORAL | Status: DC | PRN

## 2012-06-28 NOTE — Anesthesia Preprocedure Evaluation (Signed)
Anesthesia Evaluation  Patient identified by MRN, date of birth, ID band Patient awake    Reviewed: Allergy & Precautions, H&P , NPO status , Patient's Chart, lab work & pertinent test results, reviewed documented beta blocker date and time   History of Anesthesia Complications (+) PONV  Airway Mallampati: II TM Distance: >3 FB Neck ROM: full    Dental  (+) Teeth Intact   Pulmonary neg pulmonary ROS,  +PPD treated breath sounds clear to auscultation        Cardiovascular negative cardio ROS  Rhythm:regular Rate:Normal     Neuro/Psych PSYCHIATRIC DISORDERS (anxiety, panic attacks) negative neurological ROS     GI/Hepatic negative GI ROS, Neg liver ROS,   Endo/Other  Morbid obesitySubacute thyroiditis  Renal/GU negative Renal ROS  negative genitourinary   Musculoskeletal   Abdominal   Peds  Hematology negative hematology ROS (+)   Anesthesia Other Findings   Reproductive/Obstetrics (+) Pregnancy                           Anesthesia Physical Anesthesia Plan  ASA: III  Anesthesia Plan: Epidural   Post-op Pain Management:    Induction:   Airway Management Planned:   Additional Equipment:   Intra-op Plan:   Post-operative Plan:   Informed Consent: I have reviewed the patients History and Physical, chart, labs and discussed the procedure including the risks, benefits and alternatives for the proposed anesthesia with the patient or authorized representative who has indicated his/her understanding and acceptance.     Plan Discussed with:   Anesthesia Plan Comments:         Anesthesia Quick Evaluation

## 2012-06-28 NOTE — Anesthesia Procedure Notes (Signed)
Epidural Patient location during procedure: OB Start time: 06/28/2012 5:55 PM  Staffing Performed by: anesthesiologist   Preanesthetic Checklist Completed: patient identified, site marked, surgical consent, pre-op evaluation, timeout performed, IV checked, risks and benefits discussed and monitors and equipment checked  Epidural Patient position: sitting Prep: site prepped and draped and DuraPrep Patient monitoring: continuous pulse ox and blood pressure Approach: midline Injection technique: LOR air  Needle:  Needle type: Tuohy  Needle gauge: 17 G Needle length: 9 cm and 9 Needle insertion depth: 7 cm Catheter type: closed end flexible Catheter size: 19 Gauge Catheter at skin depth: 12 cm Test dose: negative  Assessment Events: blood not aspirated, injection not painful, no injection resistance, negative IV test and no paresthesia  Additional Notes Discussed risk of headache, infection, bleeding, nerve injury and failed or incomplete block.  Patient voices understanding and wishes to proceed.  Epidural placed easily on first attempt.  No paresthesia.  Patient tolerated procedure well with no apparent complications. Jasmine December, MDReason for block:procedure for pain
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
## 2012-06-28 NOTE — Progress Notes (Signed)
S: Doing well, no complaints, pain well controlled with epidural  O: BP 128/71  Pulse 89  Temp(Src) 98.1 F (36.7 C) (Oral)  Resp 20  Ht 5' 8.5" (1.74 m)  Wt 131.543 kg (290 lb)  BMI 43.45 kg/m2  SpO2 99%  LMP 11/09/2010   FHT:  FHR: 140s bpm, variability: moderate,  accelerations:  Present,  decelerations:  Absent UC:   irregular, every 5-7 minutes SVE:   Dilation: 6 Effacement (%): 100 Station: -2 Exam by:: Ernestina Penna, MD AROM - thin meconium   A / P:  37 y.o.  Obstetric History   G3   P0   T0   P0   A2   TAB0   SAB2   E0   M0   L0    at [redacted]w[redacted]d Augmentation of labor, progressing well. Will follow progress now s/p AROM, if contractions do not pick up and cervix does not continue to change may need to augment with pitocin  Fetal Wellbeing:  Category I Pain Control:  Epidural  Anticipated MOD:  NSVD  Krystyna Cleckley A. 06/28/2012, 7:07 PM

## 2012-06-28 NOTE — Progress Notes (Signed)
S: Doing well, no complaints, pain well controlled with epidural  O: BP 126/67  Pulse 92  Temp(Src) 98.1 F (36.7 C) (Oral)  Resp 18  Ht 5' 8.5" (1.74 m)  Wt 131.543 kg (290 lb)  BMI 43.45 kg/m2  SpO2 99%  LMP 11/09/2010   FHT:  FHR: 140's bpm, variability: moderate,  accelerations:  Present,  decelerations:  Present single decel for 1 min with cervical exam UC:   irregular, every 2-5 minutes SVE:   Dilation: 5 Effacement (%): 100 Station: 0 Exam by:: Dr. Ernestina Penna   A / P:  37 y.o.  Obstetric History   G3   P0   T0   P0   A2   TAB0   SAB2   E0   M0   L0    at [redacted]w[redacted]d Augmentation of labor, progressing well, some change in descent since AROM but cvx regressed from 6 to 5 cm, unclear if true change or 6 cm was a generous assessment. 100%. Head now well applied. IUPC placed now, may need to start pitocin if contractions not adequate.   Fetal Wellbeing:  Category I Pain Control:  Epidural  Anticipated MOD:  NSVD  Mary [name] A. 06/28/2012, 8:50 PM

## 2012-06-29 ENCOUNTER — Encounter (HOSPITAL_COMMUNITY): Payer: Self-pay | Admitting: *Deleted

## 2012-06-29 LAB — RPR: RPR Ser Ql: NONREACTIVE

## 2012-06-29 MED ORDER — DIPHENHYDRAMINE HCL 25 MG PO CAPS: 25.0000 mg | ORAL_CAPSULE | Freq: Four times a day (QID) | ORAL | Status: DC | PRN

## 2012-06-29 MED ORDER — LANOLIN HYDROUS EX OINT: TOPICAL_OINTMENT | CUTANEOUS | Status: DC | PRN

## 2012-06-29 MED ORDER — ONDANSETRON HCL 4 MG/2ML IJ SOLN: 4.0000 mg | INTRAMUSCULAR | Status: DC | PRN

## 2012-06-29 MED ORDER — BENZOCAINE-MENTHOL 20-0.5 % EX AERO
1.0000 "application " | INHALATION_SPRAY | CUTANEOUS | Status: DC | PRN
  Administered 2012-06-29: 1 via TOPICAL
  Filled 2012-06-29: qty 56

## 2012-06-29 MED ORDER — IBUPROFEN 600 MG PO TABS
600.0000 mg | ORAL_TABLET | Freq: Four times a day (QID) | ORAL | Status: DC
  Administered 2012-06-29 – 2012-06-30 (×5): 600 mg via ORAL
  Filled 2012-06-29 (×5): qty 1

## 2012-06-29 MED ORDER — TETANUS-DIPHTH-ACELL PERTUSSIS 5-2.5-18.5 LF-MCG/0.5 IM SUSP: 0.5000 mL | Freq: Once | INTRAMUSCULAR | Status: DC

## 2012-06-29 MED ORDER — BISACODYL 10 MG RE SUPP: 10.0000 mg | Freq: Every day | RECTAL | Status: DC | PRN

## 2012-06-29 MED ORDER — OXYCODONE-ACETAMINOPHEN 5-325 MG PO TABS: 1.0000 | ORAL_TABLET | ORAL | Status: DC | PRN

## 2012-06-29 MED ORDER — ONDANSETRON HCL 4 MG PO TABS: 4.0000 mg | ORAL_TABLET | ORAL | Status: DC | PRN

## 2012-06-29 MED ORDER — FLUOXETINE HCL 20 MG PO CAPS
40.0000 mg | ORAL_CAPSULE | Freq: Every day | ORAL | Status: DC
  Administered 2012-06-29 – 2012-06-30 (×2): 40 mg via ORAL
  Filled 2012-06-29 (×2): qty 2

## 2012-06-29 MED ORDER — PRENATAL MULTIVITAMIN CH
1.0000 | ORAL_TABLET | Freq: Every day | ORAL | Status: DC
  Administered 2012-06-29: 1 via ORAL
  Filled 2012-06-29: qty 1

## 2012-06-29 MED ORDER — ZOLPIDEM TARTRATE 5 MG PO TABS: 5.0000 mg | ORAL_TABLET | Freq: Every evening | ORAL | Status: DC | PRN

## 2012-06-29 MED ORDER — SENNOSIDES-DOCUSATE SODIUM 8.6-50 MG PO TABS
2.0000 | ORAL_TABLET | Freq: Every day | ORAL | Status: DC
  Administered 2012-06-29: 2 via ORAL

## 2012-06-29 MED ORDER — SIMETHICONE 80 MG PO CHEW: 80.0000 mg | CHEWABLE_TABLET | ORAL | Status: DC | PRN

## 2012-06-29 MED ORDER — WITCH HAZEL-GLYCERIN EX PADS: 1.0000 "application " | MEDICATED_PAD | CUTANEOUS | Status: DC | PRN

## 2012-06-29 MED ORDER — FLEET ENEMA 7-19 GM/118ML RE ENEM: 1.0000 | ENEMA | Freq: Every day | RECTAL | Status: DC | PRN

## 2012-06-29 MED ORDER — DIBUCAINE 1 % RE OINT: 1.0000 "application " | TOPICAL_OINTMENT | RECTAL | Status: DC | PRN

## 2012-06-29 NOTE — Clinical Social Work Note (Signed)
CSW spoke with MOB about hx.  MOB reports she is doing fine, no current concerns and has been managed on Prozac medication.  Patient was referred for history of depression/anxiety.  * Referral screened out by Clinical Social Worker because none of the following criteria appear to apply: ~ History of anxiety/depression during this pregnancy, or of post-partum depression. ~ Diagnosis of anxiety and/or depression within last 3 years ~ History of depression due to pregnancy loss/loss of child  OR  * Patient's symptoms currently being treated with medication and/or therapy.  Please contact the Clinical Social Worker if needs arise, or by the patient's request.

## 2012-06-29 NOTE — Progress Notes (Signed)
PPD 0 SVD  S:  Reports feeling tired - no sleep yet             Tolerating po/ No nausea or vomiting             Bleeding is moderate             Pain controlled with motrin and percocet             Up ad lib / ambulatory / voiding QS  Newborn breast feeding  / female O:               VS: BP 126/74  Pulse 95  Temp(Src) 98.3 F (36.8 C) (Oral)  Resp 18  Ht 5' 8.5" (1.74 m)  Wt 131.543 kg (290 lb)  BMI 43.45 kg/m2  SpO2 100%  LMP 11/09/2010   LABS:  Recent Labs  06/28/12 1250  WBC 16.9*  HGB 11.1*  PLT 228                                       I&O: Intake/Output     05/02 0701 - 05/03 0700 05/03 0701 - 05/04 0700   Blood 650    Total Output 650     Net -650                      Physical Exam:             Alert and oriented X3  Lungs: Clear and unlabored  Heart: regular rate and rhythm / no mumurs  Abdomen: soft, non-tender, non-distended              Fundus: firm, non-tender, Ueven  Perineum: moderate edema / ice pack in place  Lochia: light  Extremities: 1+ pedal edema, no calf pain or tenderness    A: PPD # 0   Doing well - stable status  P:  Routine post partum orders    Marlinda Mike CNM, MSN 06/29/2012, 9:21 AM

## 2012-06-29 NOTE — Anesthesia Postprocedure Evaluation (Signed)
Anesthesia Post Note  Patient: Mary [name]  Procedure(s) Performed: * No procedures listed *  Anesthesia type: Epidural  Patient location: Mother/Baby  Post pain: Pain level controlled  Post assessment: Post-op Vital signs reviewed  Last Vitals: BP 126/74  Pulse 95  Temp(Src) 36.8 C (Oral)  Resp 18  Ht 5' 8.5" (1.74 m)  Wt 290 lb (131.543 kg)  BMI 43.45 kg/m2  SpO2 100%  LMP 11/09/2010  Post vital signs: Reviewed  Level of consciousness: awake  Complications: No apparent anesthesia complications

## 2012-06-30 LAB — CBC
Hemoglobin: 9 g/dL — ABNORMAL LOW (ref 12.0–15.0)
Platelets: 189 10*3/uL (ref 150–400)
RBC: 3.1 MIL/uL — ABNORMAL LOW (ref 3.87–5.11)
WBC: 16.4 10*3/uL — ABNORMAL HIGH (ref 4.0–10.5)

## 2012-06-30 MED ORDER — OXYCODONE-ACETAMINOPHEN 5-325 MG PO TABS: 1.0000 | ORAL_TABLET | ORAL | Status: DC | PRN

## 2012-06-30 MED ORDER — IBUPROFEN 600 MG PO TABS: 600.0000 mg | ORAL_TABLET | Freq: Four times a day (QID) | ORAL | Status: DC

## 2012-06-30 NOTE — Discharge Summary (Signed)
Obstetric Discharge Summary  Reason for Admission: onset of labor Prenatal Procedures: none Intrapartum Procedures: spontaneous vaginal delivery and vacuum Postpartum Procedures: none Complications-Operative and Postpartum: 2nd degree perineal laceration Hemoglobin  Date Value Range Status  06/30/2012 9.0* 12.0 - 15.0 g/dL Final     DELTA CHECK NOTED     REPEATED TO VERIFY     HCT  Date Value Range Status  06/30/2012 26.7* 36.0 - 46.0 % Final    Physical Exam:  General: alert, cooperative and no distress Lochia: appropriate Uterine Fundus: firm Incision: healing well DVT Evaluation: No evidence of DVT seen on physical exam.  Discharge Diagnoses: Term Pregnancy-delivered and ABL anemia - stable  Discharge Information: Date: 06/30/2012 Activity: pelvic rest Diet: routine Medications: PNV, Ibuprofen, Colace, Percocet and Prozac Condition: stable Instructions: refer to practice specific booklet Discharge to: home Follow-up Information   Follow up with Lendon Colonel., MD.   Contact information:   [address] [place] [address] [phone]       Schedule an appointment as soon as possible for a visit in 6 weeks to follow up.      Newborn Data: Live born female  Birth Weight: 6 lb 8.6 oz (2965 g) APGAR: 7, 8  Home with mother.  Marlinda Mike 06/30/2012, 10:21 AM

## 2012-06-30 NOTE — Progress Notes (Signed)
Post Partum Day 2 S/P forceps vaginal  Feeding: breast Subjective: No HA, SOB, CP, F/C, breast symptoms. Normal vaginal bleeding, no clots. Pain controlled.  ambulating without symptoms.  voiding without difficulty, no HA, no vision change  Objective: BP 114/7  Pulse 74  Temp(Src) 97.6 F (36.4 C) (Oral)  Resp 18  Ht 5' 8.5" (1.74 m)  Wt 131.543 kg (290 lb)  BMI 43.45 kg/m2  SpO2 97%  LMP 11/09/2010 I&O reviewed.   Physical Exam:  General: alert and cooperative Lochia: appropriate Uterine Fundus: firm DVT Evaluation: No evidence of DVT seen on physical exam. Ext: No c/c/e  Recent Labs  06/28/12 1250 06/30/12 0640  HGB 11.1* 9.0*  HCT 32.9* 26.7*      Assessment/Plan: 37 y.o.  PPD #2 .  normal postpartum exam Continue current postpartum care Discharge home   LOS: 2 days   Shella Lahman A. 06/30/2012 9:45 AM

## 2012-07-02 ENCOUNTER — Telehealth (HOSPITAL_COMMUNITY): Payer: Self-pay | Admitting: *Deleted

## 2012-07-02 NOTE — Telephone Encounter (Signed)
Preadmission screen  

## 2012-07-03 ENCOUNTER — Telehealth: Payer: Self-pay | Admitting: Family Medicine

## 2012-07-03 NOTE — Telephone Encounter (Signed)
Called and LMOVM for patient; she can call back as needed.  Was calling since she had recently delivered.

## 2012-07-09 ENCOUNTER — Inpatient Hospital Stay (HOSPITAL_COMMUNITY): Admission: RE | Admit: 2012-07-09 | Payer: BC Managed Care – PPO | Source: Ambulatory Visit

## 2012-11-18 ENCOUNTER — Other Ambulatory Visit: Payer: Self-pay | Admitting: Family Medicine

## 2012-11-18 NOTE — Telephone Encounter (Signed)
Electronic refill request.  Longer than 6 months since seen in the office.  Please advise.

## 2012-11-18 NOTE — Telephone Encounter (Signed)
Sent.  Please schedule patient.

## 2012-11-19 NOTE — Telephone Encounter (Signed)
Left detailed message on voicemail.  

## 2012-12-26 ENCOUNTER — Ambulatory Visit (INDEPENDENT_AMBULATORY_CARE_PROVIDER_SITE_OTHER): Payer: 59 | Admitting: Family Medicine

## 2012-12-26 ENCOUNTER — Encounter: Payer: Self-pay | Admitting: Family Medicine

## 2012-12-26 VITALS — BP 122/84 | HR 82 | Temp 98.7°F | Wt 256.2 lb

## 2012-12-26 DIAGNOSIS — Z23 Encounter for immunization: Secondary | ICD-10-CM

## 2012-12-26 DIAGNOSIS — F411 Generalized anxiety disorder: Secondary | ICD-10-CM

## 2012-12-26 MED ORDER — FLUOXETINE HCL 40 MG PO CAPS: ORAL_CAPSULE | ORAL | Status: DC

## 2012-12-26 MED ORDER — FLUOXETINE HCL 10 MG PO CAPS: ORAL_CAPSULE | ORAL | Status: DC

## 2012-12-26 NOTE — Progress Notes (Signed)
F/u for anxiety.  Doing well at home. Daughter is doing well.  Breastfeeding.  Staying at home with her.  "Manageable" stress level.  Doing well with meds. Mood is good.  No BZD use.  Getting some sleep.  Husband helping at home.  Meds, vitals, and allergies reviewed.   ROS: See HPI.  Otherwise, noncontributory.  nad ncat Mmm rrr ctab abd soft Ext w/o edema Speech and affect wnl, bright.

## 2012-12-26 NOTE — Assessment & Plan Note (Signed)
Doing well, continue as is with 50mg  prozac and f/u prn.  She agrees.

## 2012-12-26 NOTE — Patient Instructions (Signed)
Take care.  Don't change your meds. Call if you have concerns.  Glad to see you.

## 2012-12-27 ENCOUNTER — Other Ambulatory Visit: Payer: Self-pay | Admitting: Family Medicine

## 2013-11-17 IMAGING — US US OB TRANSVAGINAL
1 series · 14 of 28 positions shown · non-contrast
Comparison: None.

CLINICAL DATA: Pregnant patient with vaginal bleeding.

OBSTETRIC <14 WK US AND TRANSVAGINAL OB US
TECHNIQUE: Both transabdominal and transvaginal ultrasound
examinations were performed for complete evaluation of the
gestation as well as the maternal uterus, adnexal regions, and
pelvic cul-de-sac.  Transvaginal technique was performed to assess
early pregnancy.

[Series 1: us ob comp less 14 wks · 14 of 41 slices shown]
[im 2/41]
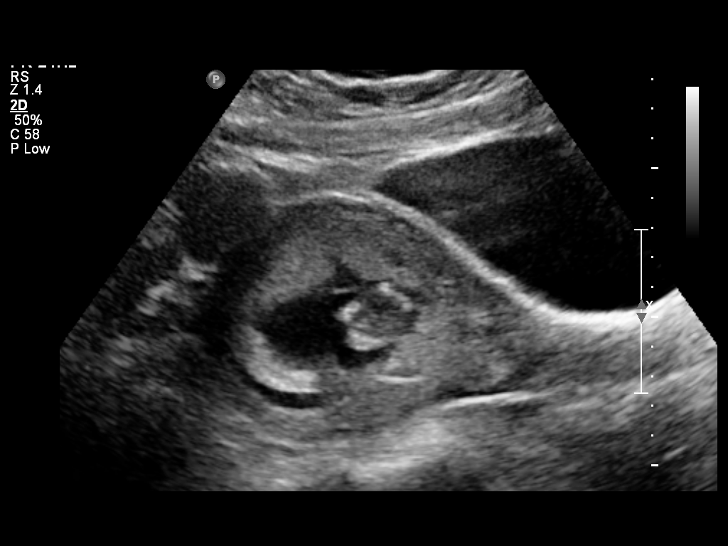
[im 5/41]
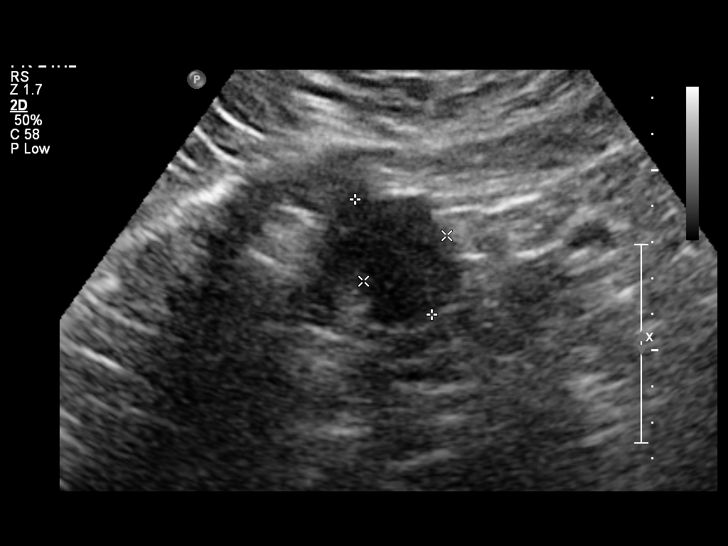
[im 8/41]
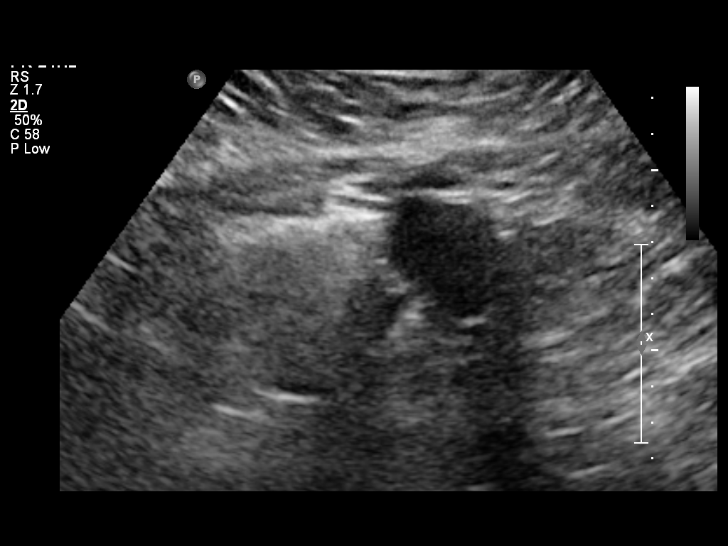
[im 11/41]
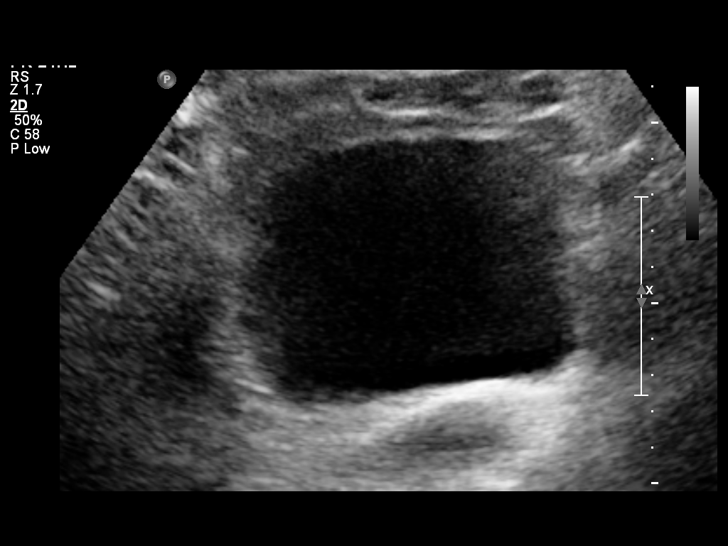
[im 14/41]
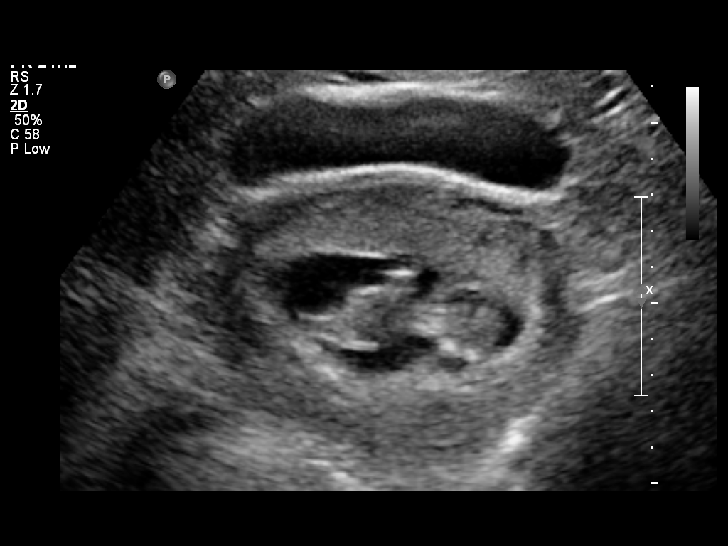
[im 17/41]
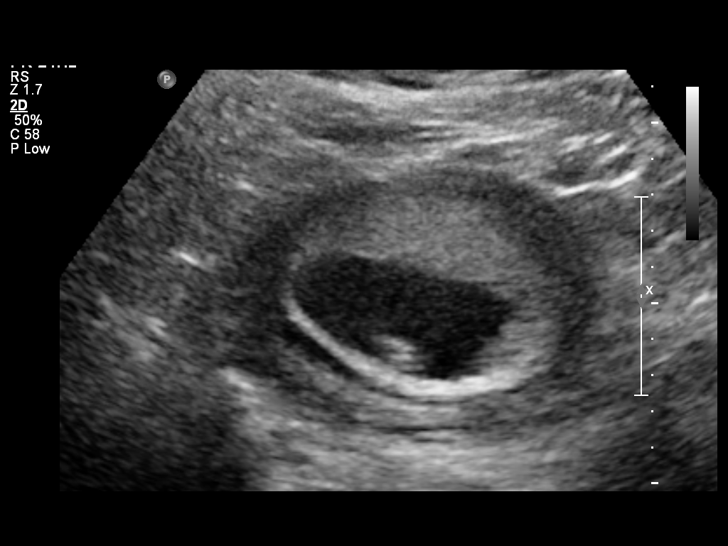
[im 20/41]
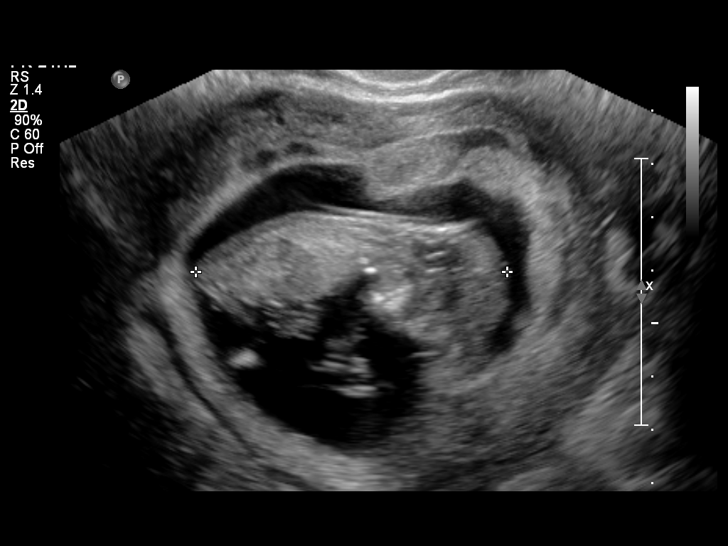
[im 23/41]
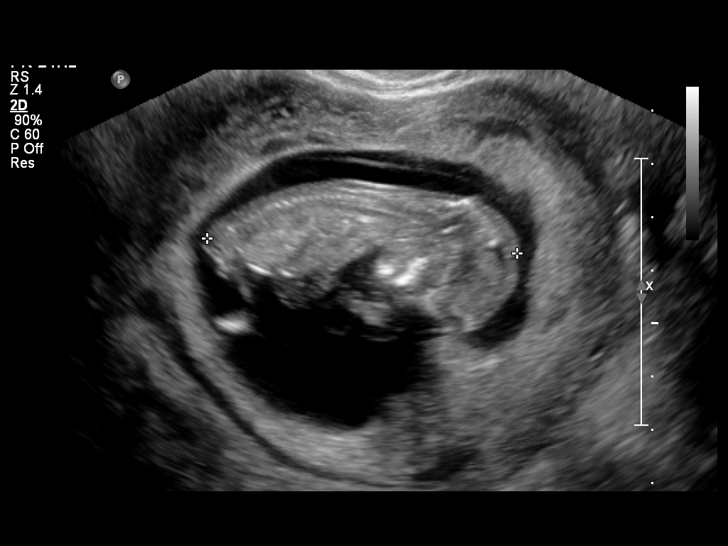
[im 26/41]
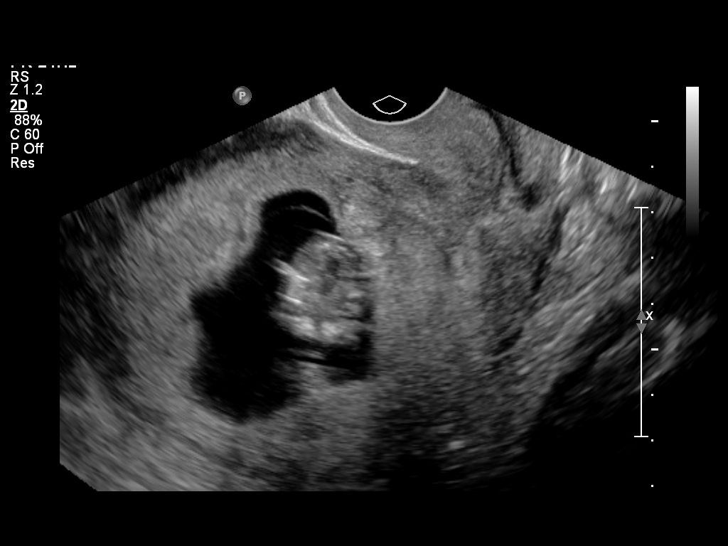
[im 29/41]
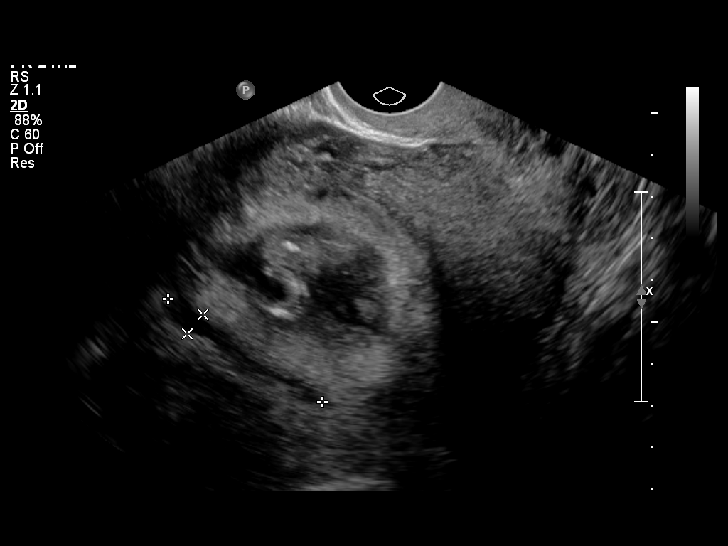
[im 32/41]
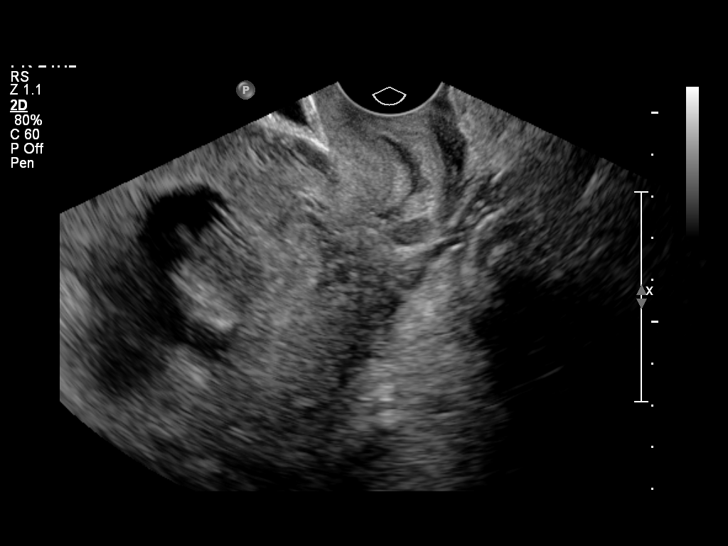
[im 35/41]
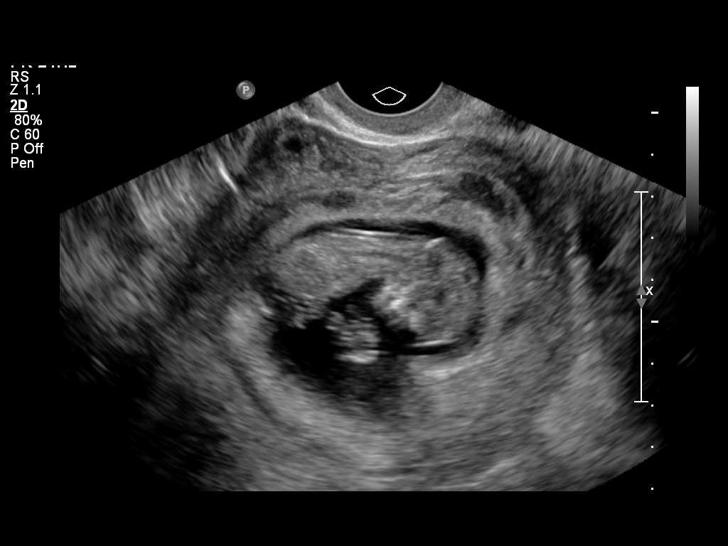
[im 38/41]
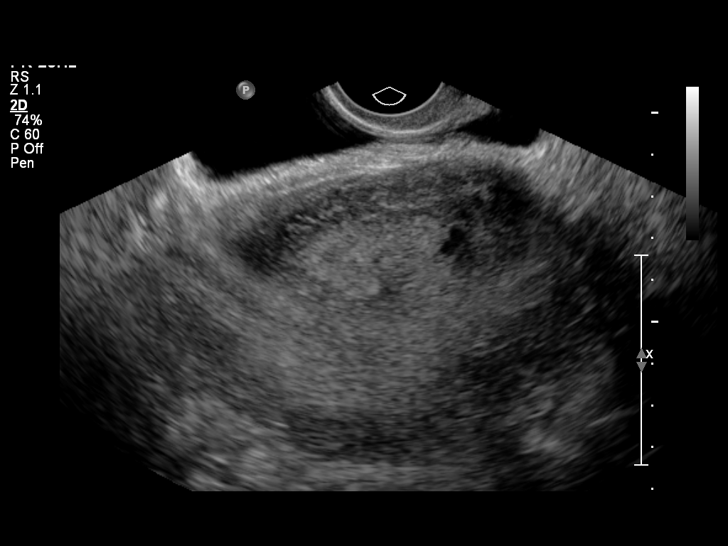
[im 41/41]
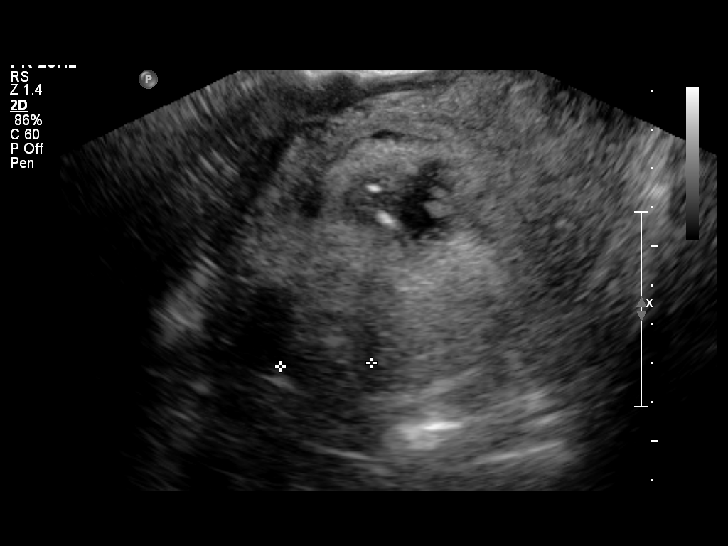

[14 of 28 positions shown; findings below may reference images not displayed]

Intrauterine gestational sac:  Visualized/normal in shape.Very
small subchorionic hemorrhage is identified.
Yolk sac: Not visualized.
Embryo: Visualized.
Cardiac Activity: Detected.
Heart Rate: 162 bpm

CRL: 5.79  mm  12 w  3 d            US EDC: 07/05/2012.

Maternal uterus/adnexae:
Unremarkable.
IMPRESSION: Single living intrauterine pregnancy.  Small subchorionic
hemorrhage is noted.

## 2013-12-29 ENCOUNTER — Encounter: Payer: Self-pay | Admitting: Family Medicine

## 2013-12-31 ENCOUNTER — Other Ambulatory Visit: Payer: Self-pay | Admitting: *Deleted

## 2013-12-31 MED ORDER — ALPRAZOLAM 0.25 MG PO TABS: 0.2500 mg | ORAL_TABLET | Freq: Two times a day (BID) | ORAL | Status: DC | PRN

## 2013-12-31 NOTE — Telephone Encounter (Signed)
Pt calling asking for a refill of XANAX, ( not on med list) pt states she only needs it occasionally. Please advise [facility]

## 2013-12-31 NOTE — Telephone Encounter (Signed)
Rx called to pharmacy. Detailed message left on voicemail to call the office for an appointment as instructed.

## 2013-12-31 NOTE — Telephone Encounter (Signed)
Please call in and get a f/u appointment set when possible.  She had been on the med prev. Thanks.

## 2014-01-16 ENCOUNTER — Other Ambulatory Visit: Payer: Self-pay | Admitting: Family Medicine

## 2014-02-10 ENCOUNTER — Other Ambulatory Visit: Payer: Self-pay | Admitting: Family Medicine

## 2014-02-10 NOTE — Telephone Encounter (Signed)
Electronic refill request.   Prozac 40 mg.   30 capsule 0 RF on 01/16/2014  Prozac 10 mg.  Last Filled:    30 capsule 0 RF on 01/16/2014  Patient not seen since 12/26/12.  Please advise.

## 2014-02-10 NOTE — Telephone Encounter (Signed)
Sent, please set up f/u OV.  Thanks.

## 2014-02-11 NOTE — Telephone Encounter (Signed)
Please schedule appointment per Dr. Duncan. 

## 2014-02-11 NOTE — Telephone Encounter (Signed)
Left voicemail for patient to call and schedule follow up.

## 2014-02-16 ENCOUNTER — Ambulatory Visit (INDEPENDENT_AMBULATORY_CARE_PROVIDER_SITE_OTHER): Payer: 59 | Admitting: Family Medicine

## 2014-02-16 ENCOUNTER — Encounter: Payer: Self-pay | Admitting: Family Medicine

## 2014-02-16 VITALS — BP 124/78 | HR 89 | Temp 99.0°F | Wt 263.5 lb

## 2014-02-16 DIAGNOSIS — F411 Generalized anxiety disorder: Secondary | ICD-10-CM

## 2014-02-16 MED ORDER — FLUOXETINE HCL 20 MG PO CAPS: 60.0000 mg | ORAL_CAPSULE | Freq: Every day | ORAL | Status: DC

## 2014-02-16 NOTE — Assessment & Plan Note (Signed)
D/w pt about increasing child care demands and her stress level.  Okay for outpatient f/u.  She'll check on getting a baby sitter to get some free time.  She is working on weaning her child and that should help.  Inc prozac to 60mg  a day in the meantime.  She agrees.  F/u prn.

## 2014-02-16 NOTE — Patient Instructions (Signed)
Try increasing the prozac to 60mg  a day and see if that helps.   Take care.  Glad to see you.  Call back as needed.

## 2014-02-16 NOTE — Progress Notes (Signed)
Pre visit review using our clinic review tool, if applicable. No additional management support is needed unless otherwise documented below in the visit note.  Anxiety.  Taking xanax occ, more since the fall of 2015.  Still on 50mg  prozac, she asked about inc in dose of SSRI. At home, caring for daughter full time.   We talked about family support, getting a Public librarian, getting a break from child care.  She agrees that is reasonable.   No SI/HI.  She and her daughter are health o/w.   Meds, vitals, and allergies reviewed.   ROS: See HPI.  Otherwise, noncontributory.  nad ncat Mmm Neck supple, no LA rrr ctab Affect wnl, speech wnl

## 2014-06-02 ENCOUNTER — Other Ambulatory Visit: Payer: Self-pay | Admitting: Family Medicine

## 2014-06-03 NOTE — Telephone Encounter (Signed)
Please call in.  Thanks.   

## 2014-06-03 NOTE — Telephone Encounter (Signed)
Rx called to pharmacy as instructed. 

## 2014-06-03 NOTE — Telephone Encounter (Signed)
Received refill request electronically. Last refill 12/31/13 #60/1, last office visit 02/16/14. Is it okay to refill medication?

## 2014-08-23 ENCOUNTER — Other Ambulatory Visit: Payer: Self-pay | Admitting: Family Medicine

## 2014-08-24 NOTE — Telephone Encounter (Signed)
Please call in.  Thanks.   

## 2014-08-24 NOTE — Telephone Encounter (Signed)
Received refill request electronically from pharmacy. Last refill 06/03/14 #60/1 Last office visit 02/16/14 Is it okay to refill medication?

## 2014-08-25 NOTE — Telephone Encounter (Signed)
Rx called to pharmacy as instructed. 

## 2014-09-15 ENCOUNTER — Other Ambulatory Visit: Payer: Self-pay | Admitting: Family Medicine

## 2014-09-15 MED ORDER — FLUOXETINE HCL 10 MG PO CAPS: ORAL_CAPSULE | ORAL | Status: DC

## 2014-09-15 NOTE — Telephone Encounter (Signed)
Noted, thanks!

## 2014-09-15 NOTE — Telephone Encounter (Signed)
Pt called wanting to lower the dose of her prozac from 60mg  daily and she wants to change it back to 50mg   Is this ok???

## 2014-09-15 NOTE — Telephone Encounter (Signed)
Okay with me if doing well.  If she can't break her pills in half, then please send in #90 of the 10mg  tabs with 1 rf to take with 2x20mg  to make 50mg  total per day.  Thanks.

## 2014-09-15 NOTE — Telephone Encounter (Signed)
Patient advised.  Patient asks that I let you know she is seeing Alvis Lemmings, therapist, and she is doing that along with the medication.   Medication phoned to pharmacy.

## 2014-11-11 ENCOUNTER — Other Ambulatory Visit: Payer: Self-pay | Admitting: Family Medicine

## 2014-11-12 NOTE — Telephone Encounter (Signed)
Electronic refill request, last OV was a med refill appt on 02/16/14 an no future appt., last refilled on 08/24/14 #60 with 1 additional refill, please advise

## 2014-11-12 NOTE — Telephone Encounter (Signed)
Schedule for this winter when convenient.  Please call in.  Thanks.

## 2014-11-12 NOTE — Telephone Encounter (Signed)
Rx called in as directed. Message left for patient to return call in regards to scheduling an appt.

## 2014-12-24 ENCOUNTER — Other Ambulatory Visit: Payer: Self-pay | Admitting: Family Medicine

## 2014-12-24 NOTE — Telephone Encounter (Signed)
This is early.  Should have some left.  How is she doing?  Let me know. Thanks.

## 2014-12-24 NOTE — Telephone Encounter (Signed)
Alprazolam last refilled 11/12/14 for #60 with 1 refill. Last office visit was 02/16/14. Ok to refill this medication?

## 2014-12-25 NOTE — Telephone Encounter (Signed)
Message left for patient to return my phone call.

## 2014-12-28 NOTE — Telephone Encounter (Signed)
Left message on voicemail for patient to call back. 

## 2014-12-28 NOTE — Telephone Encounter (Deleted)
Electronic refill request. Last office visit:   02/16/2014.  Last Filled:    60 tablet 1 11/12/2014  Please advise.

## 2014-12-29 NOTE — Telephone Encounter (Signed)
Patient states that the Prozac 60 mg was making her feel "weird" so she has progessively cut that back from 60mg  to 50mg  and now to 40 mg but she has needed to take the Alprazolam twice daily rather than twice daily prn.  Patient says there are days that she doesn't have to take anything but on the days around her ovulation, she sometimes has to take 3 or 4 Alprazolam because of anxiety attacks, even in her sleep.

## 2014-12-30 MED ORDER — FLUOXETINE HCL 20 MG PO CAPS: 40.0000 mg | ORAL_CAPSULE | Freq: Every day | ORAL | Status: DC

## 2014-12-30 NOTE — Telephone Encounter (Signed)
Noted.  This is reasonable.  Please call in with new sig and #.  Thanks.  Med list updated.

## 2014-12-30 NOTE — Telephone Encounter (Signed)
Rx called to pharmacy as instructed. 

## 2015-03-02 ENCOUNTER — Other Ambulatory Visit: Payer: Self-pay | Admitting: Family Medicine

## 2015-03-02 NOTE — Telephone Encounter (Signed)
Last office visit:   02/16/2014. Last Filled:   12/30/2014  ? Quantity.  Please advise.    No upcoming appts scheduled.

## 2015-03-03 NOTE — Telephone Encounter (Signed)
Yes.  rx changed to 40mg  a day in EMR.  Please notify pharmacy/patient.  Tell patient I appreciate the callback/correction.  Thanks.

## 2015-03-03 NOTE — Telephone Encounter (Signed)
Sent.  Schedule f/u when feasible.  Thanks.  

## 2015-03-03 NOTE — Telephone Encounter (Signed)
Pt left v/m; pt said she got refill on fluoxetine but not correct instructions; pt said she takes fluoxetine 40 mg once daily. Pt request cb. [facility] [address] Is it OK to change instructions on med list and resend refill.

## 2015-03-03 NOTE — Telephone Encounter (Signed)
Pharmacist notified as instructed by telephone and verbalized understanding. Left message for patient to call back.Patient needs to schedule follow-up appointment.

## 2015-03-04 NOTE — Telephone Encounter (Signed)
Left detailed message on voicemail to schedule follow up appt.

## 2015-03-08 ENCOUNTER — Ambulatory Visit: Payer: Self-pay | Admitting: Family Medicine

## 2015-03-15 ENCOUNTER — Ambulatory Visit (INDEPENDENT_AMBULATORY_CARE_PROVIDER_SITE_OTHER): Payer: 59 | Admitting: Family Medicine

## 2015-03-15 ENCOUNTER — Encounter: Payer: Self-pay | Admitting: Family Medicine

## 2015-03-15 VITALS — BP 122/78 | HR 92 | Temp 98.5°F | Wt 276.5 lb

## 2015-03-15 DIAGNOSIS — F411 Generalized anxiety disorder: Secondary | ICD-10-CM

## 2015-03-15 NOTE — Progress Notes (Signed)
Pre visit review using our clinic review tool, if applicable. No additional management support is needed unless otherwise documented below in the visit note.  Pregnant.  Not on BZD.  Still on SSRI, with approval per patient from [facility] MD.  Anxiety clearly better while pregnant and breastfeeding.  Patient asks about options after pregnancy and breastfeeding- I'll ask for OB input on this.  Mood is good.  Happy with pregnancy and home situation.  Doing well overall.  No ADE on med.    No VB, LOV, CTX, FM yet at [redacted] weeks gestation per patient report.   On PNV.   Meds, vitals, and allergies reviewed.   ROS: See HPI.  Otherwise, noncontributory.  GEN: nad, alert and oriented, affect and speech wnl HEENT: mucous membranes moist NECK: supple w/o LA CV: rrr. PULM: ctab, no inc wob ABD: soft, +bs EXT: no edema

## 2015-03-15 NOTE — Patient Instructions (Signed)
Take care.  Glad to see you.  Congrats.  I'll check with the OB clinic.  Let me know if I can do anything.

## 2015-03-16 NOTE — Assessment & Plan Note (Signed)
Clearly better with less sx when pregnant or breastfeeding, with a likely hormonal component.  Off BZD.  Will defer other prn meds to OB.   I would like OB input on treatment after delivery re: menses, as her anxiety sx are much worse just before menses.  Will defer to OB.  Pt agrees.

## 2015-04-08 ENCOUNTER — Other Ambulatory Visit: Payer: Self-pay | Admitting: Obstetrics

## 2015-04-08 DIAGNOSIS — Z419 Encounter for procedure for purposes other than remedying health state, unspecified: Secondary | ICD-10-CM

## 2015-04-09 ENCOUNTER — Encounter (HOSPITAL_COMMUNITY): Payer: Self-pay

## 2015-04-11 NOTE — H&P (Signed)
40 yo G4P1021 (TOP, VAVD, MAB) for u/s guided D&E at 14'6 (by LMP c/w  6 wk u/s) for fetus affected by trisomy 13. Pt initially had NT screen with 1/5 risk for DS, followed by Informaseq with insufficient DNA followed by repeat Informaseq c/w T21. All options, as well as SN/SP and positive predictive value of the Informaseq test were d/w pt who declines confirmatory amniocentesis and instead wishes to proceed with D&E.  PMH: anxiety, depression,   PSH: LEEP, IPAS  Meds: PNV  All: PCN, Sulfa  PE:   A/P: [redacted] wks pregnant with fetus affected by T21  R/B/A including additional/ confirmatory testing d/w pt who prefers to proceed with TOP. Plan u/s guided D&E after cytotec prep.

## 2015-04-12 ENCOUNTER — Inpatient Hospital Stay (HOSPITAL_COMMUNITY): Payer: 59 | Admitting: Registered Nurse

## 2015-04-12 ENCOUNTER — Encounter (HOSPITAL_COMMUNITY)
Admission: AD | Disposition: A | Discharge status: Home / Self Care | Payer: Self-pay | Source: Ambulatory Visit | Attending: Obstetrics & Gynecology

## 2015-04-12 ENCOUNTER — Ambulatory Visit (HOSPITAL_COMMUNITY): Admission: RE | Admit: 2015-04-12 | Payer: 59 | Source: Ambulatory Visit | Admitting: Obstetrics

## 2015-04-12 ENCOUNTER — Encounter (HOSPITAL_COMMUNITY): Payer: Self-pay | Admitting: *Deleted

## 2015-04-12 ENCOUNTER — Inpatient Hospital Stay (HOSPITAL_COMMUNITY): Payer: 59

## 2015-04-12 ENCOUNTER — Ambulatory Visit (HOSPITAL_COMMUNITY)
Admission: AD | Admit: 2015-04-12 | Discharge: 2015-04-12 | Disposition: A | Discharge status: Home / Self Care | Payer: 59 | Source: Ambulatory Visit | Attending: Obstetrics & Gynecology | Admitting: Obstetrics & Gynecology

## 2015-04-12 DIAGNOSIS — O351XX0 Maternal care for (suspected) chromosomal abnormality in fetus, not applicable or unspecified: Secondary | ICD-10-CM | POA: Diagnosis not present

## 2015-04-12 DIAGNOSIS — Z332 Encounter for elective termination of pregnancy: Secondary | ICD-10-CM | POA: Diagnosis not present

## 2015-04-12 DIAGNOSIS — Z3A15 15 weeks gestation of pregnancy: Secondary | ICD-10-CM | POA: Diagnosis not present

## 2015-04-12 DIAGNOSIS — O029 Abnormal product of conception, unspecified: Secondary | ICD-10-CM

## 2015-04-12 HISTORY — PX: OPERATIVE ULTRASOUND: SHX5996

## 2015-04-12 HISTORY — DX: Major depressive disorder, single episode, unspecified: F32.9

## 2015-04-12 HISTORY — DX: Thyrotoxicosis, unspecified without thyrotoxic crisis or storm: E05.90

## 2015-04-12 HISTORY — DX: Anemia, unspecified: D64.9

## 2015-04-12 HISTORY — PX: DILATION AND EVACUATION: SHX1459

## 2015-04-12 HISTORY — DX: Depression, unspecified: F32.A

## 2015-04-12 LAB — CBC WITH DIFFERENTIAL/PLATELET
BASOS PCT: 0 %
Basophils Absolute: 0 10*3/uL (ref 0.0–0.1)
EOS PCT: 3 %
Eosinophils Absolute: 0.3 10*3/uL (ref 0.0–0.7)
HEMATOCRIT: 35.9 % — AB (ref 36.0–46.0)
Hemoglobin: 12.4 g/dL (ref 12.0–15.0)
LYMPHS ABS: 2.2 10*3/uL (ref 0.7–4.0)
LYMPHS PCT: 18 %
MCH: 29 pg (ref 26.0–34.0)
MCHC: 34.5 g/dL (ref 30.0–36.0)
MCV: 83.9 fL (ref 78.0–100.0)
Monocytes Absolute: 0.5 10*3/uL (ref 0.1–1.0)
Monocytes Relative: 4 %
NEUTROS PCT: 75 %
Neutro Abs: 9.3 10*3/uL — ABNORMAL HIGH (ref 1.7–7.7)
PLATELETS: 235 10*3/uL (ref 150–400)
RBC: 4.28 MIL/uL (ref 3.87–5.11)
RDW: 14.1 % (ref 11.5–15.5)
WBC: 12.3 10*3/uL — AB (ref 4.0–10.5)

## 2015-04-12 LAB — TYPE AND SCREEN
ABO/RH(D): O POS
ANTIBODY SCREEN: NEGATIVE

## 2015-04-12 SURGERY — DILATION AND EVACUATION, UTERUS, SECOND TRIMESTER
Anesthesia: Monitor Anesthesia Care

## 2015-04-12 MED ORDER — LACTATED RINGERS IV SOLN: INTRAVENOUS | Status: DC

## 2015-04-12 MED ORDER — METHYLERGONOVINE MALEATE 0.2 MG PO TABS
0.2000 mg | ORAL_TABLET | Freq: Three times a day (TID) | ORAL | Status: DC
Start: 2015-04-12 — End: 2016-03-22

## 2015-04-12 MED ORDER — ONDANSETRON HCL 4 MG/2ML IJ SOLN
INTRAMUSCULAR | Status: DC | PRN
  Administered 2015-04-12: 4 mg via INTRAVENOUS

## 2015-04-12 MED ORDER — DEXTROSE 5 % IV SOLN
100.0000 mg | Freq: Once | INTRAVENOUS | Status: AC
  Administered 2015-04-12: 100 mg via INTRAVENOUS
  Filled 2015-04-12: qty 100

## 2015-04-12 MED ORDER — LIDOCAINE HCL (CARDIAC) 20 MG/ML IV SOLN
INTRAVENOUS | Status: DC | PRN
  Administered 2015-04-12: 50 mg via INTRAVENOUS

## 2015-04-12 MED ORDER — PROMETHAZINE HCL 25 MG/ML IJ SOLN: 6.2500 mg | INTRAMUSCULAR | Status: DC | PRN

## 2015-04-12 MED ORDER — PROPOFOL 10 MG/ML IV BOLUS
INTRAVENOUS | Status: DC | PRN
  Administered 2015-04-12: 100 mg via INTRAVENOUS

## 2015-04-12 MED ORDER — PROPOFOL 500 MG/50ML IV EMUL
INTRAVENOUS | Status: DC | PRN
  Administered 2015-04-12: 100 ug/kg/min via INTRAVENOUS

## 2015-04-12 MED ORDER — OXYCODONE-ACETAMINOPHEN 5-325 MG PO TABS: 1.0000 | ORAL_TABLET | ORAL | Status: DC | PRN

## 2015-04-12 MED ORDER — DEXAMETHASONE SODIUM PHOSPHATE 10 MG/ML IJ SOLN
INTRAMUSCULAR | Status: AC
  Filled 2015-04-12: qty 1

## 2015-04-12 MED ORDER — HYDROMORPHONE HCL 1 MG/ML IJ SOLN: 0.2500 mg | INTRAMUSCULAR | Status: DC | PRN

## 2015-04-12 MED ORDER — MIDAZOLAM HCL 2 MG/2ML IJ SOLN
INTRAMUSCULAR | Status: DC | PRN
  Administered 2015-04-12: 2 mg via INTRAVENOUS

## 2015-04-12 MED ORDER — GLYCOPYRROLATE 0.2 MG/ML IJ SOLN
INTRAMUSCULAR | Status: AC
  Filled 2015-04-12: qty 3

## 2015-04-12 MED ORDER — FENTANYL CITRATE (PF) 100 MCG/2ML IJ SOLN
INTRAMUSCULAR | Status: DC | PRN
  Administered 2015-04-12: 100 ug via INTRAVENOUS
  Administered 2015-04-12: 50 ug via INTRAVENOUS
  Administered 2015-04-12: 100 ug via INTRAVENOUS

## 2015-04-12 MED ORDER — SODIUM CHLORIDE 0.9% FLUSH
INTRAVENOUS | Status: AC
  Filled 2015-04-12: qty 3

## 2015-04-12 MED ORDER — SCOPOLAMINE 1 MG/3DAYS TD PT72
1.0000 | MEDICATED_PATCH | Freq: Once | TRANSDERMAL | Status: DC
  Administered 2015-04-12: 1.5 mg via TRANSDERMAL
  Filled 2015-04-12: qty 1

## 2015-04-12 MED ORDER — METHYLERGONOVINE MALEATE 0.2 MG/ML IJ SOLN
INTRAMUSCULAR | Status: DC | PRN
  Administered 2015-04-12: 0.2 mg via INTRAMUSCULAR

## 2015-04-12 MED ORDER — LIDOCAINE HCL (CARDIAC) 20 MG/ML IV SOLN
INTRAVENOUS | Status: AC
  Filled 2015-04-12: qty 5

## 2015-04-12 MED ORDER — MIDAZOLAM HCL 2 MG/2ML IJ SOLN
INTRAMUSCULAR | Status: AC
  Filled 2015-04-12: qty 2

## 2015-04-12 MED ORDER — LACTATED RINGERS IV BOLUS (SEPSIS): 1000.0000 mL | Freq: Once | INTRAVENOUS | Status: DC

## 2015-04-12 MED ORDER — FAMOTIDINE IN NACL 20-0.9 MG/50ML-% IV SOLN
20.0000 mg | Freq: Once | INTRAVENOUS | Status: AC
  Administered 2015-04-12: 20 mg via INTRAVENOUS
  Filled 2015-04-12: qty 50

## 2015-04-12 MED ORDER — NEOSTIGMINE METHYLSULFATE 10 MG/10ML IV SOLN
INTRAVENOUS | Status: AC
  Filled 2015-04-12: qty 1

## 2015-04-12 MED ORDER — ROCURONIUM BROMIDE 100 MG/10ML IV SOLN
INTRAVENOUS | Status: AC
  Filled 2015-04-12: qty 1

## 2015-04-12 MED ORDER — ONDANSETRON HCL 4 MG/2ML IJ SOLN
INTRAMUSCULAR | Status: AC
  Filled 2015-04-12: qty 2

## 2015-04-12 MED ORDER — CHLOROPROCAINE HCL 1 % IJ SOLN
INTRAMUSCULAR | Status: DC | PRN
  Administered 2015-04-12: 20 mL

## 2015-04-12 MED ORDER — KETOROLAC TROMETHAMINE 30 MG/ML IJ SOLN: 30.0000 mg | Freq: Once | INTRAMUSCULAR | Status: DC

## 2015-04-12 MED ORDER — LACTATED RINGERS IV SOLN
INTRAVENOUS | Status: DC
  Administered 2015-04-12 (×2): via INTRAVENOUS

## 2015-04-12 MED ORDER — DEXAMETHASONE SODIUM PHOSPHATE 10 MG/ML IJ SOLN
INTRAMUSCULAR | Status: DC | PRN
  Administered 2015-04-12: 10 mg via INTRAVENOUS

## 2015-04-12 MED ORDER — PROPOFOL 10 MG/ML IV BOLUS
INTRAVENOUS | Status: AC
  Filled 2015-04-12: qty 20

## 2015-04-12 MED ORDER — CHLOROPROCAINE HCL 1 % IJ SOLN
INTRAMUSCULAR | Status: AC
  Filled 2015-04-12: qty 30

## 2015-04-12 MED ORDER — GLYCOPYRROLATE 0.2 MG/ML IJ SOLN
INTRAMUSCULAR | Status: DC | PRN
  Administered 2015-04-12: 0.2 mg via INTRAVENOUS

## 2015-04-12 MED ORDER — CITRIC ACID-SODIUM CITRATE 334-500 MG/5ML PO SOLN
30.0000 mL | Freq: Once | ORAL | Status: AC
  Administered 2015-04-12: 30 mL via ORAL
  Filled 2015-04-12: qty 15

## 2015-04-12 MED ORDER — KETOROLAC TROMETHAMINE 30 MG/ML IJ SOLN
INTRAMUSCULAR | Status: AC
  Filled 2015-04-12: qty 1

## 2015-04-12 MED ORDER — FENTANYL CITRATE (PF) 250 MCG/5ML IJ SOLN
INTRAMUSCULAR | Status: AC
  Filled 2015-04-12: qty 5

## 2015-04-12 SURGICAL SUPPLY — 20 items
ADAPTER VACURETTE TBG SET 14 (CANNULA) ×2 IMPLANT
CLOTH BEACON ORANGE TIMEOUT ST (SAFETY) ×2 IMPLANT
CONT PATH 16OZ SNAP LID 3702 (MISCELLANEOUS) ×2 IMPLANT
CONTAINER PREFILL 10% NBF 60ML (FORM) IMPLANT
GLOVE BIO SURGEON STRL SZ 6.5 (GLOVE) ×2 IMPLANT
GLOVE BIOGEL PI IND STRL 7.0 (GLOVE) ×2 IMPLANT
GLOVE BIOGEL PI INDICATOR 7.0 (GLOVE) ×2
GOWN STRL REUS W/TWL LRG LVL3 (GOWN DISPOSABLE) ×4 IMPLANT
KIT BERKELEY 1ST TRIMESTER 3/8 (MISCELLANEOUS) ×1 IMPLANT
KIT BERKELEY 2ND TRIMESTER 1/2 (COLLECTOR) ×2 IMPLANT
NS IRRIG 1000ML POUR BTL (IV SOLUTION) ×2 IMPLANT
PACK VAGINAL MINOR WOMEN LF (CUSTOM PROCEDURE TRAY) ×2 IMPLANT
PAD OB MATERNITY 4.3X12.25 (PERSONAL CARE ITEMS) ×2 IMPLANT
PAD PREP 24X48 CUFFED NSTRL (MISCELLANEOUS) ×2 IMPLANT
SET BERKELEY SUCTION TUBING (SUCTIONS) ×1 IMPLANT
TOWEL OR 17X24 6PK STRL BLUE (TOWEL DISPOSABLE) ×4 IMPLANT
TUBE VACURETTE 2ND TRIMESTER (CANNULA) ×2 IMPLANT
VACURETTE 12 RIGID CVD (CANNULA) ×1 IMPLANT
VACURETTE 14MM CVD 1/2 BASE (CANNULA) IMPLANT
VACURETTE 16MM ASPIR CVD .5 (CANNULA) ×1 IMPLANT

## 2015-04-12 NOTE — MAU Note (Addendum)
Pt scheduled for D&E for 2PM today.  Took 4 cytotec pill at 0900 and states, "I felt my water break" about 1000. Pt continues to leak clear pink fluid at this time.   Also states she is having vaginal bleeding at this time.

## 2015-04-12 NOTE — Op Note (Signed)
04/12/2015  6:25 PM  PATIENT:  Mary [name]  40 y.o. female  PRE-OPERATIVE DIAGNOSIS:  15wks, Trisomy 21, ruptured membranes  POST-OPERATIVE DIAGNOSIS:  15wks, Trisomy 21, ruptured membranes  PROCEDURE:  Procedure(s): DILATATION AND EVACUATION (D&E) 2ND TRIMESTER (N/A) OPERATIVE ULTRASOUND (N/A)  SURGEON:  Surgeon(s) and Role:    * Aloha Gell, MD - Primary  PHYSICIAN ASSISTANT:   ASSISTANTS: none   ANESTHESIA:   local and MAC  EBL:  Total I/O In: 1000 [I.V.:1000] Out: 50 [Blood:50]  BLOOD ADMINISTERED:none  DRAINS: none   LOCAL MEDICATIONS USED:  OTHER 20 cc 1% Nesicaine paracervical block  SPECIMEN:  Source of Specimen:  POC's  DISPOSITION OF SPECIMEN:  PATHOLOGY  COUNTS:  YES  TOURNIQUET:  * No tourniquets in log *  DICTATION: .Note written in EPIC  PLAN OF CARE: Discharge to home after PACU  PATIENT DISPOSITION:  PACU - hemodynamically stable.   Delay start of Pharmacological VTE agent (>24hrs) due to surgical blood loss or risk of bleeding: yes  Antibiotic: 100 mg of IV doxycycline Findings: 15  weeks size uterus preprocedure, decreased uterine size post procedure with adequate hemostasis. thin endometrial stripe at  1.3 cm with no flow suggestive retained products of conception Complications: None Indications: A  40 yo G4P1021 at 15 wks with known T21 pregnancy, planning surgical TOP who presents several hours prior to planned D&E with contractions, bleeding and ROM shortly after buccal administration of pre-procedure cytotec. All options had been reviewed prior with R/B of each.   Procedure: After informed was obtained the patient was taken to the operating room where MAC anesthesia was initiated without difficulty. She was prepped and draped in the normal fashion in dorsal supine lithotomy position.  No bladder drainage was done due  to the desire for a full bladder for improved ultrasound guidance. A bimanual examination was performed to assess  the size and the position of the uterus. A sterile speculum was placed into the vagina. Fetal small parts were seen extruding through the cervix. 20cc paracervical block split between 5 and 7 o'clock.  A single-tooth tenaculum was used to grasp the anterior lip of the cervix.  The cervix was serially dilated with Kennon Rounds dilators to a #49. A 16 French suction curet was advanced past the internal os under ultrasound guidance. Suction curettage was carried out. Several passes were made until the uterine cavity was evacuated. Ring forceps were used at times to remove fetal tissue and especially the calvarium. A sharp curet was used to gently palpate the uterine walls to ensure a gritty cry. There remained a small area by u/s with concern for retained POCs. Tubing and currette were changed for a 36F currette. 2 additional passes were done and endometrial stripe was thin without blood flow.  Hemostasis was noted and the decision was made to end the procedure. Ultrasound was used to confirm a thin endometrial stripe and no blood flow. The tenaculum was removed and the tenaculum site was hemostatic. Bimanual examination was done to confirm good uterine tone but limited by patients habitus. Methergine was given. The procedure was then terminated. Patient tolerated the procedure well sponge lap and needle count were correct x3. Patient seems recovery room in stable condition.  Shandell Giovanni A. 01/13/2012 10:53 AM

## 2015-04-12 NOTE — Transfer of Care (Signed)
Immediate Anesthesia Transfer of Care Note  Patient: Mary [name]  Procedure(s) Performed: Procedure(s): DILATATION AND EVACUATION (D&E) 2ND TRIMESTER (N/A) OPERATIVE ULTRASOUND (N/A)  Patient Location: PACU  Anesthesia Type:MAC  Level of Consciousness: awake, alert  and oriented  Airway & Oxygen Therapy: Patient Spontanous Breathing  Post-op Assessment: Report given to RN, Post -op Vital signs reviewed and stable and Patient moving all extremities X 4  Post vital signs: Reviewed and stable  Last Vitals:  Filed Vitals:   04/12/15 1127 04/12/15 1208  BP: 137/83 138/75  Pulse: 94 94  Temp:  36.8 C  Resp:      Complications: No apparent anesthesia complications

## 2015-04-12 NOTE — Discharge Instructions (Signed)

## 2015-04-12 NOTE — Brief Op Note (Signed)
04/12/2015  6:25 PM  PATIENT:  Mary [name]  40 y.o. female  PRE-OPERATIVE DIAGNOSIS:  15wks, Trisomy 21, ruptured membranes  POST-OPERATIVE DIAGNOSIS:  15wks, Trisomy 21, ruptured membranes  PROCEDURE:  Procedure(s): DILATATION AND EVACUATION (D&E) 2ND TRIMESTER (N/A) OPERATIVE ULTRASOUND (N/A)  SURGEON:  Surgeon(s) and Role:    * Aloha Gell, MD - Primary  PHYSICIAN ASSISTANT:   ASSISTANTS: none   ANESTHESIA:   local and MAC  EBL:  Total I/O In: 1000 [I.V.:1000] Out: 50 [Blood:50]  BLOOD ADMINISTERED:none  DRAINS: none   LOCAL MEDICATIONS USED:  OTHER 20 cc 1% Nesicaine paracervical block  SPECIMEN:  Source of Specimen:  POC's  DISPOSITION OF SPECIMEN:  PATHOLOGY  COUNTS:  YES  TOURNIQUET:  * No tourniquets in log *  DICTATION: .Note written in EPIC  PLAN OF CARE: Discharge to home after PACU  PATIENT DISPOSITION:  PACU - hemodynamically stable.   Delay start of Pharmacological VTE agent (>24hrs) due to surgical blood loss or risk of bleeding: yes

## 2015-04-12 NOTE — Anesthesia Preprocedure Evaluation (Addendum)
Anesthesia Evaluation  Patient identified by MRN, date of birth, ID band Patient awake    Reviewed: Allergy & Precautions, H&P , NPO status , Patient's Chart, lab work & pertinent test results  History of Anesthesia Complications (+) PONV and history of anesthetic complications  Airway Mallampati: II  TM Distance: >3 FB Neck ROM: full    Dental no notable dental hx.    Pulmonary neg pulmonary ROS,    Pulmonary exam normal breath sounds clear to auscultation       Cardiovascular negative cardio ROS   Rhythm:regular Rate:Normal     Neuro/Psych negative neurological ROS  negative psych ROS   GI/Hepatic negative GI ROS, Neg liver ROS,   Endo/Other  Hyperthyroidism   Renal/GU negative Renal ROS     Musculoskeletal   Abdominal   Peds  Hematology   Anesthesia Other Findings   Reproductive/Obstetrics (+) Pregnancy                             Anesthesia Physical Anesthesia Plan  ASA: II and emergent  Anesthesia Plan: MAC   Post-op Pain Management:    Induction:   Airway Management Planned:   Additional Equipment:   Intra-op Plan:   Post-operative Plan:   Informed Consent: I have reviewed the patients History and Physical, chart, labs and discussed the procedure including the risks, benefits and alternatives for the proposed anesthesia with the patient or authorized representative who has indicated his/her understanding and acceptance.   Dental Advisory Given  Plan Discussed with: Anesthesiologist  Anesthesia Plan Comments:        Anesthesia Quick Evaluation

## 2015-04-12 NOTE — Anesthesia Postprocedure Evaluation (Signed)
Anesthesia Post Note  Patient: Merrillyn Litwiller  Procedure(s) Performed: Procedure(s) (LRB): DILATATION AND EVACUATION (D&E) 2ND TRIMESTER (N/A) OPERATIVE ULTRASOUND (N/A)  Patient location during evaluation: PACU Anesthesia Type: MAC Level of consciousness: awake and alert Pain management: pain level controlled Vital Signs Assessment: post-procedure vital signs reviewed and stable Respiratory status: spontaneous breathing, nonlabored ventilation, respiratory function stable and patient connected to nasal cannula oxygen Cardiovascular status: stable and blood pressure returned to baseline Anesthetic complications: no    Last Vitals:  Filed Vitals:   04/12/15 1500 04/12/15 1515  BP: 124/71   Pulse: 81 81  Temp:  37 C  Resp: 22 26    Last Pain:  Filed Vitals:   04/12/15 1517  PainSc: 2                  Briyanna Billingham DANIEL

## 2015-04-12 NOTE — H&P (Signed)
40 yo G4P1021 (TOP, VAVD, MAB) for u/s guided D&E at 14'6 (by LMP c/w 6 wk u/s) for fetus affected by trisomy 32. Pt initially had NT screen with 1/5 risk for DS, followed by Informaseq with insufficient DNA followed by repeat Informaseq c/w T21. All options, as well as SN/SP and positive predictive value of the Informaseq test were d/w pt who declines confirmatory amniocentesis and instead wishes to proceed with D&E.  Pt took cytotec at 8 am, started bleeding, cramping, LOF at 10 am.   PMH: anxiety, depression,   PSH: LEEP, IPAS  Meds: PNV  All: PCN, Sulfa  PE: Filed Vitals:   04/12/15 1117 04/12/15 1127 04/12/15 1208  BP: 145/83 137/83 138/75  Pulse: 106 94 94  Temp: 98.4 F (36.9 C)  98.3 F (36.8 C)  TempSrc: Oral  Oral  Resp: 20    SpO2: 99%     Gen: well appearing, no distress Abd: obese, NT GU: minimal bleeding, LOF LE: NT, no edema  CBC    Component Value Date/Time   WBC 12.3* 04/12/2015 1145   RBC 4.28 04/12/2015 1145   HGB 12.4 04/12/2015 1145   HCT 35.9* 04/12/2015 1145   PLT 235 04/12/2015 1145   MCV 83.9 04/12/2015 1145   MCH 29.0 04/12/2015 1145   MCHC 34.5 04/12/2015 1145   RDW 14.1 04/12/2015 1145   LYMPHSABS 2.2 04/12/2015 1145   MONOABS 0.5 04/12/2015 1145   EOSABS 0.3 04/12/2015 1145   BASOSABS 0.0 04/12/2015 1145       A/P: [redacted] wks pregnant with fetus affected by T21  R/B/A including additional/ confirmatory testing d/w pt who prefers to proceed with TOP. Plan u/s guided D&E after cytotec prep.   Hevin Jeffcoat A. 04/12/2015 1:03 PM

## 2015-04-13 ENCOUNTER — Encounter (HOSPITAL_COMMUNITY): Payer: Self-pay | Admitting: Obstetrics

## 2015-04-19 NOTE — Progress Notes (Signed)
Pt has no order for observation or inpt admit.  H&P and Op note sent.

## 2015-08-02 ENCOUNTER — Other Ambulatory Visit: Payer: Self-pay | Admitting: Family Medicine

## 2015-08-02 NOTE — Telephone Encounter (Signed)
Electronic refill request. Last Filled:   #100 with 1 RF on 03/15/15.  Please advise. Last office visit:

## 2015-08-02 NOTE — Telephone Encounter (Signed)
Last office visit 03-15-15

## 2015-08-03 NOTE — Telephone Encounter (Signed)
Medication phoned to pharmacy.  

## 2015-09-16 ENCOUNTER — Other Ambulatory Visit: Payer: Self-pay | Admitting: Family Medicine

## 2015-09-16 NOTE — Telephone Encounter (Signed)
Electronic refill request. Last office visit:   03/15/15  Last Filled:   12/30/14  ? Quantity.  Please advise.

## 2015-09-17 NOTE — Telephone Encounter (Signed)
Sent. Thanks.   

## 2015-11-29 ENCOUNTER — Other Ambulatory Visit: Payer: Self-pay | Admitting: Family Medicine

## 2015-11-30 NOTE — Telephone Encounter (Signed)
Electronic refill request. Last Filled:    100 tablet 1 08/02/2015  Last office visit:   03/15/15  FU  Please advise.

## 2015-12-01 NOTE — Telephone Encounter (Signed)
Please call in.  Thanks.   

## 2015-12-01 NOTE — Telephone Encounter (Signed)
Rx called to pharmacy as instructed. 

## 2016-03-06 ENCOUNTER — Other Ambulatory Visit: Payer: Self-pay | Admitting: Family Medicine

## 2016-03-07 NOTE — Telephone Encounter (Signed)
Electronic refill request. Last office visit:   03/15/2015 acute Last Filled:   Alprazolam  100 tablet 1 12/01/2015  Last Filled:   Fluoxetine  180 capsule 1 09/17/2015  Please advise.

## 2016-03-08 NOTE — Telephone Encounter (Signed)
Due for f/u OV.  Please call in xanax.  prozac sent.  Thanks.

## 2016-03-08 NOTE — Telephone Encounter (Signed)
Rx called to pharmacy as instructed. Spoke to patient and follow-up appointment scheduled.

## 2016-03-15 ENCOUNTER — Ambulatory Visit: Payer: 59 | Admitting: Family Medicine

## 2016-03-22 ENCOUNTER — Encounter: Payer: Self-pay | Admitting: Family Medicine

## 2016-03-22 ENCOUNTER — Ambulatory Visit (INDEPENDENT_AMBULATORY_CARE_PROVIDER_SITE_OTHER): Payer: 59 | Admitting: Family Medicine

## 2016-03-22 DIAGNOSIS — F411 Generalized anxiety disorder: Secondary | ICD-10-CM | POA: Diagnosis not present

## 2016-03-22 NOTE — Progress Notes (Signed)
Pre visit review using our clinic review tool, if applicable. No additional management support is needed unless otherwise documented below in the visit note. 

## 2016-03-22 NOTE — Patient Instructions (Signed)
Don't change your meds.  Update me as needed.  Take care.  Glad to see you. 

## 2016-03-22 NOTE — Progress Notes (Signed)
Still is in counseling every other week after events of 2017, D&E with trisomy 21 and ruptured membranes.  No SI/HI.  She is trying to adjust/cope with the events.  No ADE on meds.    Xanax dosing is variable, from 0 to a few doses in a day.  The week before her menses her mood is clearly worse and she'll take 2-3 tabs.  Still on SSRI.   She is going to f/u with gyn about birth control options.  I asked her to check on 90 day pills to try to limit her menses and see if that would help with pre menstrual sx.   Taking unisom at night to sleep.  She prev had a lot of nightmares.    Meds, vitals, and allergies reviewed.   ROS: Per HPI unless specifically indicated in ROS section   GEN: nad, alert and oriented, affect wnl.   HEENT: mucous membranes moist NECK: supple w/o LA CV: rrr. PULM: ctab, no inc wob ABD: soft, +bs EXT: no edema

## 2016-03-23 NOTE — Assessment & Plan Note (Signed)
In counseling, on SSRI w/o ADE.  Okay for outpatient f/u.  D/w pt about events and adjusting to the subsequent changes.  Continue prn BZD, no ADE.  Patient agrees.  See above re: OCP f/u with gyn as that may help with pre menstrual sx.  Patient to update me as needed.   >25 minutes spent in face to face time with patient, >50% spent in counselling or coordination of care.

## 2016-06-28 ENCOUNTER — Other Ambulatory Visit: Payer: Self-pay | Admitting: Family Medicine

## 2016-06-28 NOTE — Telephone Encounter (Signed)
Last office visit 03/22/2016.  Last refilled 03/08/2016 for #100 with 1 refill.  Ok to refill?

## 2016-06-28 NOTE — Telephone Encounter (Signed)
Please call in.  Thanks.   

## 2016-06-29 NOTE — Telephone Encounter (Signed)
Medication phoned to pharmacy.  

## 2016-07-27 DIAGNOSIS — B338 Other specified viral diseases: Secondary | ICD-10-CM | POA: Diagnosis not present

## 2016-08-17 ENCOUNTER — Other Ambulatory Visit: Payer: Self-pay | Admitting: Family Medicine

## 2016-09-27 ENCOUNTER — Telehealth: Payer: Self-pay

## 2016-09-27 NOTE — Telephone Encounter (Signed)
Would recommend an office visit to discuss her symptoms as it's been 6 months. Please schedule this with her PCP at her convenience.

## 2016-09-27 NOTE — Telephone Encounter (Signed)
Pt left v/m; anxiety level is increasing and pt wants to know if could increase dosage on xanax or does med need to be changed. Pt last seen 03/22/16 and was advised to update Dr Damita Dunnings as needed.Please advise. [facility] [address]

## 2016-09-27 NOTE — Telephone Encounter (Signed)
Left message on voicemail for patient to call back. 

## 2016-09-27 NOTE — Telephone Encounter (Signed)
Patient notified as instructed by telephone and verbalized understanding. Patient stated that she is out of town next week and requested appointment the following week. Appointment scheduled per patient's request.

## 2016-10-08 ENCOUNTER — Other Ambulatory Visit: Payer: Self-pay | Admitting: Family Medicine

## 2016-10-09 ENCOUNTER — Ambulatory Visit: Payer: 59 | Admitting: Family Medicine

## 2016-10-09 NOTE — Telephone Encounter (Signed)
Received refill request electronically for Alprazolam Last refill 06/28/16 #100/1 Last office visit 03/22/16

## 2016-10-10 NOTE — Telephone Encounter (Signed)
Medication phoned to pharmacy.  

## 2016-10-10 NOTE — Telephone Encounter (Signed)
Please call in.  Thanks.   

## 2016-12-09 DIAGNOSIS — Z23 Encounter for immunization: Secondary | ICD-10-CM | POA: Diagnosis not present

## 2017-01-16 ENCOUNTER — Other Ambulatory Visit: Payer: Self-pay | Admitting: Family Medicine

## 2017-01-16 NOTE — Telephone Encounter (Signed)
Copied from [place] #[id number]. Topic: Quick Communication - See Telephone Encounter >> Jan 16, 2017 10:22 AM Ether Griffins B wrote: CRM for notification. See Telephone encounter for:  Nira Conn for [facility] calling on behalf of pt. Pt needing xanax filled. Nira Conn wanting to know if it would be possible to get pts rx called in before pt is seen in the office. She states the pt will be calling today to make an appt.  01/16/17.

## 2017-01-16 NOTE — Telephone Encounter (Signed)
Electronic refill request. Alprazolam Last office visit:   03/22/16 Last Filled:     100 tablet 1 10/10/2016  See note from [place] below.

## 2017-01-17 NOTE — Telephone Encounter (Signed)
Rx called to pharmacy as instructed. Left detailed message on voicemail for patient to call back and schedule office visit when possible. (on DPR)

## 2017-01-17 NOTE — Telephone Encounter (Signed)
Please call in.  Thanks.  OV when possible.  Thanks.

## 2017-01-25 ENCOUNTER — Encounter: Payer: Self-pay | Admitting: Family Medicine

## 2017-01-25 ENCOUNTER — Ambulatory Visit (INDEPENDENT_AMBULATORY_CARE_PROVIDER_SITE_OTHER): Payer: 59 | Admitting: Family Medicine

## 2017-01-25 VITALS — BP 118/82 | HR 87 | Temp 98.0°F | Wt 265.8 lb

## 2017-01-25 DIAGNOSIS — Z131 Encounter for screening for diabetes mellitus: Secondary | ICD-10-CM

## 2017-01-25 DIAGNOSIS — F411 Generalized anxiety disorder: Secondary | ICD-10-CM

## 2017-01-25 DIAGNOSIS — Z8639 Personal history of other endocrine, nutritional and metabolic disease: Secondary | ICD-10-CM | POA: Diagnosis not present

## 2017-01-25 LAB — TSH: TSH: 1.28 u[IU]/mL (ref 0.35–4.50)

## 2017-01-25 LAB — GLUCOSE, RANDOM: GLUCOSE: 91 mg/dL (ref 70–99)

## 2017-01-25 NOTE — Progress Notes (Signed)
Flu shot done 11/2016 at pharmacy.    D/w pt about routine gyn f/u.  She'll call about appointment.    Anxiety.  She is busy, her child is 40 years old, in preschool.  She has sig anxiety around the time of period and also around ovulation.  D/w pt about possible OCP use.  She was worried about possible thyroiditis with OCP use.  Still on prozac with prn BZD use.  No SI/HI.  She is still in counseling.  Still with some anxiety/panic episodes.  Encouraged exercise- had not been exercising much recently.    Meds, vitals, and allergies reviewed.   ROS: Per HPI unless specifically indicated in ROS section   GEN: nad, alert and oriented HEENT: mucous membranes moist NECK: supple w/o LA CV: rrr. PULM: ctab, no inc wob ABD: soft, +bs EXT: no edema Speech fluent, no tremor.

## 2017-01-25 NOTE — Patient Instructions (Signed)
Call GYN.  Take care.  Glad to see you.  Update me as needed.  Go to the lab on the way out.  We'll contact you with your lab report.

## 2017-01-26 NOTE — Assessment & Plan Note (Signed)
See notes on labs. 

## 2017-01-26 NOTE — Assessment & Plan Note (Addendum)
With h/o thyroiditis.  No tmg on exam.  Recheck tsh today.   Likely would benefit from OCP addition, assuming she can tolerate that w/o inc risk of thyroiditis.  D/w pt.  She'll f/u with gyn. Wouldn't change SSRI or BZD at this point.  No SI/HI.  D/w pt about exercise.  She agrees.  See AVS. She'll update me as needed, after seeing GYN clinic.  >25 minutes spent in face to face time with patient, >50% spent in counselling or coordination of care.

## 2017-02-08 ENCOUNTER — Other Ambulatory Visit: Payer: Self-pay | Admitting: Family Medicine

## 2017-02-22 ENCOUNTER — Telehealth: Payer: Self-pay | Admitting: Family Medicine

## 2017-02-22 NOTE — Telephone Encounter (Signed)
No answer, no opportunity to leave a message.

## 2017-02-22 NOTE — Telephone Encounter (Signed)
Please call pt.  I talked to [employer] about patient.  Please find out about the timing of patient's psychiatry f/u (I think this is pending) and let me know if patient can continue as is with current meds in the meantime (until that appointment).  Thanks.

## 2017-02-22 NOTE — Telephone Encounter (Signed)
Patient doesn't have an appointment yet because she just found someone that accepts her insurance on Friday.  Patient will be making an appointment as soon as she can.  Patient is fine with continuing current meds until that time.  Patient advised to call us if that becomes a problem.

## 2017-04-25 ENCOUNTER — Encounter: Payer: Self-pay | Admitting: Family Medicine

## 2017-04-25 ENCOUNTER — Other Ambulatory Visit: Payer: Self-pay | Admitting: Family Medicine

## 2017-04-26 NOTE — Telephone Encounter (Signed)
Last filled: 03/09/17 Last OV: 01/25/17 No future OV

## 2017-04-27 NOTE — Telephone Encounter (Signed)
Sent. Thanks.   

## 2017-05-10 DIAGNOSIS — Z79899 Other long term (current) drug therapy: Secondary | ICD-10-CM | POA: Diagnosis not present

## 2017-05-28 ENCOUNTER — Encounter: Payer: Self-pay | Admitting: Family Medicine

## 2017-05-29 ENCOUNTER — Other Ambulatory Visit: Payer: Self-pay | Admitting: Family Medicine

## 2017-06-03 ENCOUNTER — Other Ambulatory Visit: Payer: Self-pay | Admitting: Family Medicine

## 2017-06-04 NOTE — Telephone Encounter (Signed)
Electronic refill Last refill 04/27/17 #60 Last office visit 01/25/17

## 2017-06-05 NOTE — Telephone Encounter (Signed)
Sent. Thanks.  See my last mychart messages, she needs 30 min f/u.   The plan was to potentially taper her meds.

## 2017-06-05 NOTE — Telephone Encounter (Signed)
Left detailed message on voicemail.  

## 2017-06-12 ENCOUNTER — Ambulatory Visit: Payer: 59 | Admitting: Family Medicine

## 2017-06-12 DIAGNOSIS — Z0289 Encounter for other administrative examinations: Secondary | ICD-10-CM

## 2017-06-26 ENCOUNTER — Ambulatory Visit: Payer: 59 | Admitting: Family Medicine

## 2017-07-03 DIAGNOSIS — Z0000 Encounter for general adult medical examination without abnormal findings: Secondary | ICD-10-CM | POA: Diagnosis not present

## 2017-07-03 DIAGNOSIS — Z01419 Encounter for gynecological examination (general) (routine) without abnormal findings: Secondary | ICD-10-CM | POA: Diagnosis not present

## 2017-07-03 DIAGNOSIS — Z131 Encounter for screening for diabetes mellitus: Secondary | ICD-10-CM | POA: Diagnosis not present

## 2017-07-03 DIAGNOSIS — Z13220 Encounter for screening for lipoid disorders: Secondary | ICD-10-CM | POA: Diagnosis not present

## 2017-07-04 LAB — LAB REPORT - SCANNED
A1C: 5.2
ALT: 25
AST: 23
CHOLESTEROL, TOTAL: 294
Creatinine, Ser: 0.76
GLUCOSE: 92
HDL: 79
Hemoglobin: 13.7
PLATELETS: 267
TSH: 0.92

## 2017-07-04 LAB — HM PAP SMEAR

## 2017-07-06 ENCOUNTER — Encounter: Payer: Self-pay | Admitting: Family Medicine

## 2017-07-06 ENCOUNTER — Ambulatory Visit (INDEPENDENT_AMBULATORY_CARE_PROVIDER_SITE_OTHER): Payer: 59 | Admitting: Family Medicine

## 2017-07-06 VITALS — BP 114/76 | HR 90 | Temp 98.9°F | Ht 69.0 in | Wt 275.0 lb

## 2017-07-06 DIAGNOSIS — F411 Generalized anxiety disorder: Secondary | ICD-10-CM

## 2017-07-06 DIAGNOSIS — R635 Abnormal weight gain: Secondary | ICD-10-CM

## 2017-07-06 MED ORDER — ALPRAZOLAM 0.25 MG PO TABS: 0.2500 mg | ORAL_TABLET | Freq: Two times a day (BID) | ORAL | 1 refills | Status: DC | PRN

## 2017-07-06 NOTE — Progress Notes (Signed)
She had recent labs and mammogram done this week at [facility].  I'll await the records.    Anxiety.  Still on 40mg  prozac with averaging 2 dose of BZD per day, occ with extra dose needed.  She felt like the benefit from prozac is sig lower and then had more anxiety based on that with subsequent inc BZD use.  She is depressed some of the time, anxious some of the time.  More anxious/depressed prior to menses.  She is going to start birth control per OBGYN.  It may be the case that OCPS will sig help.  She may need 90 day sets of pills, d/w pt.    Occ/rare etoh.  Not smoking.  No illicits.  No SI/HI. She never had a manic episode.  No h/o SZ.   The question is about adding med vs changing prozac if sx aren't better on OCPs.    She is still in counseling re: emotional eating.  We talked about nutrition referral.  She has lost and gained wait in the past.  We talked about trying to get to the root of her issues with anxiety and stress eating.  This would likely be more productive than treating her with weight loss medications.  She completely agrees and want to avoid weight loss medications if at all possible.  Meds, vitals, and allergies reviewed.   ROS: Per HPI unless specifically indicated in ROS section   GEN: nad, alert and oriented, speech normal.   HEENT: mucous membranes moist NECK: supple w/o LA CV: rrr.  PULM: ctab, no inc wob ABD: soft, +bs EXT: no edema SKIN: no acute rash

## 2017-07-06 NOTE — Patient Instructions (Signed)
Keep going with counseling.  We will call about your referral.  Rosaria Ferries or Azalee Course will call you if you don't see one of them on the way out.  Try OCPs in the meantime and see how that helps.   Let me know.   Take care.  Glad to see you.

## 2017-07-08 DIAGNOSIS — R635 Abnormal weight gain: Secondary | ICD-10-CM | POA: Insufficient documentation

## 2017-07-08 NOTE — Assessment & Plan Note (Addendum)
The question is about adding med vs changing prozac if sx aren't better on OCPs.  It likely makes sense to do one thing at a time.  She is going to start OCPs and then update me as needed.  It may be the case that she would benefit from a change from Prozac to venlafaxine but we will defer this for now.  Rationale discussed with patient and she agrees.  Continue with as needed benzodiazepine as needed for now.  Continue Prozac for now. >25 minutes spent in face to face time with patient, >50% spent in counselling or coordination of care.

## 2017-07-08 NOTE — Assessment & Plan Note (Signed)
She is still in counseling re: emotional eating.  We talked about nutrition referral.  She has lost and gained wait in the past.  See orders.  I appreciate the help of all involved.

## 2017-08-04 ENCOUNTER — Encounter: Payer: Self-pay | Admitting: Family Medicine

## 2017-08-04 ENCOUNTER — Other Ambulatory Visit: Payer: Self-pay | Admitting: Family Medicine

## 2017-08-06 ENCOUNTER — Other Ambulatory Visit: Payer: Self-pay | Admitting: *Deleted

## 2017-08-06 MED ORDER — FLUOXETINE HCL 20 MG PO CAPS: ORAL_CAPSULE | ORAL | 1 refills | Status: DC

## 2017-08-08 ENCOUNTER — Telehealth: Payer: Self-pay | Admitting: Family Medicine

## 2017-08-08 NOTE — Telephone Encounter (Signed)
Rec'd from [facility] forward 6 pages to Dr.Duncan Phillip Heal

## 2017-08-14 ENCOUNTER — Encounter: Payer: Self-pay | Admitting: Family Medicine

## 2017-08-20 ENCOUNTER — Ambulatory Visit: Payer: 59 | Admitting: Family Medicine

## 2017-08-21 ENCOUNTER — Encounter: Payer: Self-pay | Admitting: Family Medicine

## 2017-08-24 ENCOUNTER — Other Ambulatory Visit: Payer: Self-pay | Admitting: Family Medicine

## 2017-08-24 DIAGNOSIS — E785 Hyperlipidemia, unspecified: Secondary | ICD-10-CM

## 2017-09-11 ENCOUNTER — Other Ambulatory Visit: Payer: Self-pay | Admitting: Family Medicine

## 2017-09-12 NOTE — Telephone Encounter (Signed)
Last OV 07/06/17 Next OV none scheduled Last refill 07/06/17 #75 with 1 refill

## 2017-09-12 NOTE — Telephone Encounter (Signed)
Sent.  Did she get on OCPs per gyn clinic?  Is that helping?  What is her status?  Let me know.  Thanks.

## 2017-09-12 NOTE — Telephone Encounter (Signed)
Attempted to reach patient. Unable to leave message.

## 2017-09-13 ENCOUNTER — Ambulatory Visit (INDEPENDENT_AMBULATORY_CARE_PROVIDER_SITE_OTHER): Payer: 59 | Admitting: Family Medicine

## 2017-09-13 ENCOUNTER — Encounter: Payer: Self-pay | Admitting: Family Medicine

## 2017-09-13 DIAGNOSIS — R635 Abnormal weight gain: Secondary | ICD-10-CM | POA: Diagnosis not present

## 2017-09-13 DIAGNOSIS — E785 Hyperlipidemia, unspecified: Secondary | ICD-10-CM | POA: Diagnosis not present

## 2017-09-13 NOTE — Patient Instructions (Addendum)
-   Call [facility] to confirm coverage of medical nutrition therapy.   - Try mixing plain with sweetened yogurt.   - Intuitive eating is a good way to eat, and a goal to aspire to.  It also needs to include mindfulness.  Intuitive eating at its best is also tempered by a good working knowledge of what constitutes a nutritionally balanced diet.    - Recognize that while satisfaction from our food is IMPERATIVE to making good food choices, it is also possible (and desirable) to expand taste preferences to include all those foods that we would like to like.  Our preference for sweet taste is innate in humans, but the magnitude of that preference can be manipulated.    - We tend to prefer what we are used to getting.    - An outcome goal for you: Develop more mindfulness with respect to eating.   Goals: 1. Always see all you eat; PLATE YOUR FOOD.  No eating out of the bag/container.   2. Sit down for all meals (and snacks when possible). 3. Eat at least 3 REAL meals and 1-2 snacks per day.  Aim for no more than 5 hours between eating.  Eat breakfast within one hour of getting up.  A REAL meal includes at least some protein, some starch, and vegetables and/or fruit. OR: Would you serve this to a guest in your home, and call it a meal? 4. Meal planning: Obtain twice the volume of vegetables as either protein or starchy foods for both lunch and dinner.  - Every meal should look like, taste like, and feel like a real meal.    - AT FOLLOW-UP, WE WILL TALK ABOUT EMOTIONAL EATING.

## 2017-09-13 NOTE — Telephone Encounter (Signed)
Patient states she has been placed on OCP's from gyn for about 2 months now and it is not really helping.

## 2017-09-13 NOTE — Progress Notes (Signed)
Medical Nutrition Therapy:  Appt start time: 0900 end time: 1000. PCP Renford Dills, MD Therapist Alvis Lemmings, MSW, LCSW  Assessment:  Primary concerns today: Weight management.  Cela has struggled with weight for most of her life, having tried multiple diets.  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  Her dad was alcoholic and abusive.  She grew up in [place] with two older brothers and both parents.  Experienced food insecurity for most/all of childhood.  She has been seeing counselor Production assistant, radio for about 10 years.    A goal Zetta has now is to learn how to eat in a healthy way, optimizing how she feels and functions, rather than focuring exclusively on weight loss, as she has done so much in the past - to great frustration.  Pricila's total cholesterol has recently been elevated (194), which also concerns her, and which she believes will likely respond to weight loss.    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    Learning Readiness: Ready  Usual eating pattern includes 2 meals and 3 snacks per day. Frequent foods and beverages include coffee, 1/4 c alm milk vanilla creamer, 1 tsp sugar, water.  Avoided foods include beef, pork, most red meat, milk, sweet potatoes.   Usual physical activity includes general activities of a mom of a 5-YO; goes to pool ~2 X wk, camps sometimes as well.  24-hr recall: (Up at 6:30 AM) B (7:30 AM)-   3/4 c [place] granola, 1/2 c Chobani vanilla Grk yogurt, 1/2 c blueberries, 1 c coffee, 1 tsp sugar, 1/4c creamer Snk (10 AM)-   6 oz juice, 2 c popcorn (at movies) L (1 PM)-  10 corn chips, salsa, 1 slc bread, 1 tbsp Nutella. [place] Snk ( PM)-  1 apple, 1/4 c Goldfish D (6 PM)-  1 ear corn, 2 c potatoes, 1/2 lb shrimp, 1 oz [place] kielbasa, water Snk (8:30)-  12 oz beer Typical day? Yes.    Progress Towards Goal(s):  In  progress.   Nutritional Diagnosis:  NB-1.7 Undesireable food choices As related to non-hunger eating.  As evidenced by self-report of inability to manage food decisions patient knows are not advancing her health goals.    Intervention:  Nutrition counseling  Handouts given during visit include:  After-Visit Summary (AVS)  Goals Sheet  Liink to article re how the wellness industry is just the weight loss industry in disguise: https://www.nytimes.com/2017/08/04/opinion/sunday/women-dieting-wellness.html  Demonstrated degree of understanding via:  Teach Back  Barriers to learning/adherence to lifestyle change: long history of unhealthy relationship with food.    Monitoring/Evaluation:  Dietary intake, exercise, and body weight in 6 week(s).

## 2017-09-14 NOTE — Telephone Encounter (Signed)
If she doesn't notice an improvement with OCPs in the next month or so, then reasonable to set up OV to discuss options.   The question is about adding med vs changing prozac if sx aren't better on OCPs (meanting possible change to venlafaxine, etc).  This is assuming her sx were sig enough to necessitate the change.   I would like her input in about another month, sooner if needed.  Thanks.

## 2017-09-14 NOTE — Telephone Encounter (Signed)
Left detailed message on voicemail. DPR 

## 2017-10-23 ENCOUNTER — Ambulatory Visit: Payer: 59 | Admitting: Family Medicine

## 2017-11-19 ENCOUNTER — Other Ambulatory Visit: Payer: Self-pay | Admitting: Family Medicine

## 2017-11-19 NOTE — Telephone Encounter (Signed)
Electronic refill request Last office visit 07/06/17 Last refill 09/12/17 #75/1

## 2017-11-19 NOTE — Telephone Encounter (Signed)
Sent. Thanks.   

## 2018-02-02 ENCOUNTER — Other Ambulatory Visit: Payer: Self-pay | Admitting: Family Medicine

## 2018-02-04 NOTE — Telephone Encounter (Signed)
Electronic refill request Alprazolam Last office visit 07/06/17 Last refill 11/19/17 #75/1 No upcoming appointment scheduled

## 2018-02-06 NOTE — Telephone Encounter (Signed)
Sent. Thanks.   

## 2018-02-07 ENCOUNTER — Other Ambulatory Visit: Payer: Self-pay | Admitting: *Deleted

## 2018-02-07 ENCOUNTER — Encounter: Payer: Self-pay | Admitting: Family Medicine

## 2018-02-07 MED ORDER — FLUOXETINE HCL 10 MG PO CAPS: ORAL_CAPSULE | ORAL | Status: DC

## 2018-02-07 MED ORDER — ALPRAZOLAM 0.25 MG PO TABS: ORAL_TABLET | ORAL | 1 refills | Status: DC

## 2018-02-07 NOTE — Telephone Encounter (Signed)
Spoke to pt who states [facility] does not have [facility].25 in stock, and pt is requesting Rx be switched to [facility]

## 2018-02-07 NOTE — Telephone Encounter (Signed)
Sent to [employer]. Thanks.  I sent her a Pharmacist, community message.

## 2018-02-08 ENCOUNTER — Other Ambulatory Visit: Payer: Self-pay | Admitting: Family Medicine

## 2018-04-03 ENCOUNTER — Encounter: Payer: Self-pay | Admitting: Family Medicine

## 2018-04-05 ENCOUNTER — Other Ambulatory Visit: Payer: Self-pay | Admitting: Family Medicine

## 2018-04-05 MED ORDER — FLUOXETINE HCL 10 MG PO CAPS: ORAL_CAPSULE | ORAL | 1 refills | Status: DC

## 2018-04-16 ENCOUNTER — Other Ambulatory Visit: Payer: Self-pay | Admitting: Family Medicine

## 2018-04-17 NOTE — Telephone Encounter (Signed)
Name of Medication:alprazolam 0.25 mg  Name of Pharmacy: [facility] or Written Date and Quantity:# 75 x 1 on 02/07/18  Last Office Visit and Type: 07/06/17 FU Next Office Visit and Type: none scheduled

## 2018-05-13 ENCOUNTER — Other Ambulatory Visit: Payer: Self-pay | Admitting: Family Medicine

## 2018-05-14 NOTE — Telephone Encounter (Signed)
Electronic refill request. Alprazolam Last office visit:   07/06/2017 Last Filled:    75 tablet 0 04/17/2018  Please advise.

## 2018-05-15 NOTE — Telephone Encounter (Signed)
Sent. Thanks.   

## 2018-06-06 ENCOUNTER — Encounter: Payer: Self-pay | Admitting: Family Medicine

## 2018-06-10 ENCOUNTER — Other Ambulatory Visit: Payer: Self-pay | Admitting: Family Medicine

## 2018-06-10 MED ORDER — FLUOXETINE HCL 10 MG PO CAPS: ORAL_CAPSULE | ORAL | 1 refills | Status: DC

## 2018-06-10 MED ORDER — ALPRAZOLAM 0.25 MG PO TABS: 0.2500 mg | ORAL_TABLET | Freq: Four times a day (QID) | ORAL | 0 refills | Status: DC | PRN

## 2018-07-14 ENCOUNTER — Other Ambulatory Visit: Payer: Self-pay | Admitting: Family Medicine

## 2018-07-15 NOTE — Telephone Encounter (Signed)
Last filled 06-10-18 #120 Last OV 07-06-17  No Future OV [facility] [place]

## 2018-07-16 NOTE — Telephone Encounter (Signed)
lvm for pt to call us back to schedule follow up via virtual

## 2018-07-16 NOTE — Telephone Encounter (Signed)
Sent. Thanks.  Please get update on patient.  Please schedule f/u via web when possible.

## 2018-07-23 NOTE — Telephone Encounter (Signed)
Pt scheduled 07/29/18 @ 8:30am

## 2018-07-29 ENCOUNTER — Ambulatory Visit (INDEPENDENT_AMBULATORY_CARE_PROVIDER_SITE_OTHER): Payer: 59 | Admitting: Family Medicine

## 2018-07-29 ENCOUNTER — Telehealth: Payer: Self-pay | Admitting: Family Medicine

## 2018-07-29 DIAGNOSIS — F411 Generalized anxiety disorder: Secondary | ICD-10-CM | POA: Diagnosis not present

## 2018-07-29 NOTE — Telephone Encounter (Signed)
I called and LMOVM for patient.  I didn't close the OV note yet.  I asked her to call the clinic.   Assuming we don't hear from her, please see if you can check on her later today and reschedule.  Thanks.

## 2018-07-29 NOTE — Telephone Encounter (Signed)
Disregard, I talked with patient.  Thanks.

## 2018-07-29 NOTE — Progress Notes (Signed)
Called and LMOVM for patient 8:44 AM.  Doxy text for video prev sent to patient.   Called and LMOVM for patient at 8:53 AM.    I asked her to call back to the clinic.   Virtual visit completed through WebEx or similar program Patient location: home  Provider location: Financial controller at [facility], office   Pandemic considerations d/w pt.   Limitations and rationale for visit method d/w patient.  Patient agreed to proceed.   CC: f/u   HPI:  Anxiety.  Inc to 40mg  prozac helped, less tearful now.  Some days are better than others with variable prn xanax use.  She is anxious/overwhelmed some of the time.  We discussed coping strategies.  Still more anxious/depressed prior to menses.  She was worried about BP elevation and didn't take OCPs.  She had prev lost weight with weight watchers but then pandemic changes affected her diet and exercise.  We talked about checking BP when possible.    Occ/rare etoh.  Not smoking.  No illicits.  No SI/HI.  She had been in counseling re: emotional eating.  She is going to check on scheduling that when possible.    Husband is working from home.  Pandemic consideration d/w pt. 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  We talked about masking, etc.  It makes sense to follow routine CDC cautions, discussed.    Meds and allergies reviewed.   ROS: Per HPI unless specifically indicated in ROS section   NAD Speech wnl  A/P: Anxiety.  Still okay for outpatient f/u.   She'll f/u with counseling.   Continue SSRI, continue prn BZD.  No ADE on meds.  We talked about possible SSRI dose change, up to 50mg  in the future if needed.  She'll update me as needed.  No change in meds at this point.

## 2018-07-29 NOTE — Assessment & Plan Note (Signed)
Anxiety.  Still okay for outpatient f/u.   She'll f/u with counseling.   Continue SSRI, continue prn BZD.  No ADE on meds.  We talked about possible SSRI dose change, up to 50mg  in the future if needed.  She'll update me as needed.  No change in meds at this point.

## 2018-08-18 ENCOUNTER — Other Ambulatory Visit: Payer: Self-pay | Admitting: Family Medicine

## 2018-08-19 NOTE — Telephone Encounter (Signed)
Electronic refill request. Alprazolam Last office visit:   07/29/2018 Last Filled:    120 tablet 0 07/16/2018  Please advise.

## 2018-08-20 NOTE — Telephone Encounter (Signed)
Sent. Thanks.   

## 2018-10-18 ENCOUNTER — Encounter: Payer: Self-pay | Admitting: Family Medicine

## 2018-10-20 ENCOUNTER — Other Ambulatory Visit: Payer: Self-pay | Admitting: Family Medicine

## 2018-10-21 ENCOUNTER — Telehealth: Payer: Self-pay

## 2018-10-21 NOTE — Telephone Encounter (Addendum)
Electronic refill request. Alprazolam Last office visit:   07/29/2018 Last Filled:   120 tablet Refills: 1 Start: 08/20/2018 Please advise.

## 2018-10-21 NOTE — Telephone Encounter (Signed)
Pt has anxiety issues and has had lt arm pain since pt was in her 20's. New symptoms; 1 wk ago on and off and last episode on 10/20/18 pt has mid CP that radiates into lt neck and ear and last 15' - 20'. Pt has SOB when having CP.  Pt has had H/A for 2 wks; nausea on and off and last diarrhea was 10/18/18. No other covid symptoms and no travel and no known exposure to covid.pt will go to [facility] ED now for eval; pts husband will drive pt.FYI to Dr Damita Dunnings.

## 2018-10-22 NOTE — Telephone Encounter (Signed)
Left detailed message on voicemail. DPR 

## 2018-10-22 NOTE — Telephone Encounter (Signed)
Sent. Thanks.   

## 2018-10-22 NOTE — Telephone Encounter (Signed)
Noted.  Thanks.  Please get update on patient on Tuesday.

## 2018-12-11 LAB — HM MAMMOGRAPHY

## 2018-12-13 ENCOUNTER — Other Ambulatory Visit: Payer: Self-pay | Admitting: Family Medicine

## 2018-12-24 ENCOUNTER — Encounter: Payer: Self-pay | Admitting: Family Medicine

## 2018-12-29 ENCOUNTER — Other Ambulatory Visit: Payer: Self-pay | Admitting: Family Medicine

## 2018-12-30 NOTE — Telephone Encounter (Signed)
Electronic refill request. Alprazolam Last office visit:   07/29/2018 Last Filled:    120 tablet 1 10/22/2018  Please advise.

## 2018-12-30 NOTE — Telephone Encounter (Signed)
Sent. Thanks.   

## 2019-03-04 ENCOUNTER — Other Ambulatory Visit: Payer: Self-pay | Admitting: Family Medicine

## 2019-03-04 NOTE — Telephone Encounter (Signed)
Electronic refill request. Alprazolam Last office visit:   07/29/2018 Last Filled:     120 tablet 1 12/30/2018   Please advise.

## 2019-03-05 NOTE — Telephone Encounter (Signed)
Sent. Thanks.   

## 2019-06-12 ENCOUNTER — Other Ambulatory Visit: Payer: Self-pay | Admitting: Family Medicine

## 2019-06-13 NOTE — Telephone Encounter (Signed)
Sent. Thanks.   

## 2019-06-13 NOTE — Telephone Encounter (Signed)
Electronic refill request. Alprazolam Last office visit:   07/29/2018 Last Filled:    120 tablet 1 03/05/2019  Please advise.

## 2019-09-07 ENCOUNTER — Other Ambulatory Visit: Payer: Self-pay | Admitting: Family Medicine

## 2019-09-07 DIAGNOSIS — E785 Hyperlipidemia, unspecified: Secondary | ICD-10-CM

## 2019-09-08 ENCOUNTER — Other Ambulatory Visit: Payer: Self-pay

## 2019-09-08 ENCOUNTER — Other Ambulatory Visit (INDEPENDENT_AMBULATORY_CARE_PROVIDER_SITE_OTHER): Payer: 59

## 2019-09-08 DIAGNOSIS — E785 Hyperlipidemia, unspecified: Secondary | ICD-10-CM | POA: Diagnosis not present

## 2019-09-08 LAB — COMPREHENSIVE METABOLIC PANEL
ALT: 17 U/L (ref 0–35)
AST: 14 U/L (ref 0–37)
Albumin: 4.2 g/dL (ref 3.5–5.2)
Alkaline Phosphatase: 78 U/L (ref 39–117)
BUN: 10 mg/dL (ref 6–23)
CO2: 27 mEq/L (ref 19–32)
Calcium: 9.3 mg/dL (ref 8.4–10.5)
Chloride: 103 mEq/L (ref 96–112)
Creatinine, Ser: 0.78 mg/dL (ref 0.40–1.20)
GFR: 80.42 mL/min (ref >60.00)
Glucose, Bld: 92 mg/dL (ref 70–99)
Potassium: 4.3 mEq/L (ref 3.5–5.1)
Sodium: 137 mEq/L (ref 135–145)
Total Bilirubin: 0.4 mg/dL (ref 0.2–1.2)
Total Protein: 6.6 g/dL (ref 6.0–8.3)

## 2019-09-08 LAB — LIPID PANEL
Cholesterol: 236 mg/dL — ABNORMAL HIGH (ref 0–200)
HDL: 64.6 mg/dL (ref >39.00)
LDL Cholesterol: 139 mg/dL — ABNORMAL HIGH (ref 0–99)
NonHDL: 171.8
Total CHOL/HDL Ratio: 4
Triglycerides: 166 mg/dL — ABNORMAL HIGH (ref 0.0–149.0)
VLDL: 33.2 mg/dL (ref 0.0–40.0)

## 2019-09-08 LAB — TSH: TSH: 0.9 u[IU]/mL (ref 0.35–4.50)

## 2019-09-11 ENCOUNTER — Ambulatory Visit (INDEPENDENT_AMBULATORY_CARE_PROVIDER_SITE_OTHER): Payer: 59 | Admitting: Family Medicine

## 2019-09-11 ENCOUNTER — Other Ambulatory Visit: Payer: Self-pay

## 2019-09-11 ENCOUNTER — Encounter: Payer: Self-pay | Admitting: Family Medicine

## 2019-09-11 VITALS — BP 126/78 | HR 86 | Temp 97.7°F | Ht 68.0 in | Wt 234.3 lb

## 2019-09-11 DIAGNOSIS — Z0000 Encounter for general adult medical examination without abnormal findings: Secondary | ICD-10-CM

## 2019-09-11 DIAGNOSIS — F411 Generalized anxiety disorder: Secondary | ICD-10-CM

## 2019-09-11 DIAGNOSIS — Z7189 Other specified counseling: Secondary | ICD-10-CM

## 2019-09-11 MED ORDER — ALPRAZOLAM 0.25 MG PO TABS: ORAL_TABLET | ORAL | 2 refills | Status: DC

## 2019-09-11 MED ORDER — FLUOXETINE HCL 10 MG PO CAPS: 50.0000 mg | ORAL_CAPSULE | Freq: Every day | ORAL | 3 refills | Status: DC

## 2019-09-11 NOTE — Patient Instructions (Signed)
Thanks for your effort.  Update me as needed.  Don't change your meds for now.  Take care.  Glad to see you.

## 2019-09-11 NOTE — Progress Notes (Signed)
This visit occurred during the SARS-CoV-2 public health emergency.  Safety protocols were in place, including screening questions prior to the visit, additional usage of staff PPE, and extensive cleaning of exam room while observing appropriate contact time as indicated for disinfecting solutions.  CPE- See plan.  Routine anticipatory guidance given to patient.  See health maintenance.  The possibility exists that previously documented standard health maintenance information may have been brought forward from a previous encounter into this note.  If needed, that same information has been updated to reflect the current situation based on today's encounter.    Tetanus 2014 Flu 2020 PNA not due Shingles not due covid vaccine 2021 Mammogram 2020 DXA- not due.   Colon screening not due.   Living will d/w pt.  Husband designated if patient were incapacitated.   Diet and exercise d/w pt.  She is working on both with intentional weight loss.  Weight watchers and exercise.  Her exercise tolerance is improved.    Mood d/w pt.  Still on SSRI with prn BZD.  covid stressors d/w pt.  Her mood is better with diet and exercise.  Had been seeing [employer] with counseling.  She has cut back on xanax use.  She doesn't drink etoh.  She has baseline insomnia and nightmares.    PMH and SH reviewed  Meds, vitals, and allergies reviewed.   ROS: Per HPI.  Unless specifically indicated otherwise in HPI, the patient denies:  General: fever. Eyes: acute vision changes ENT: sore throat Cardiovascular: chest pain Respiratory: SOB GI: vomiting GU: dysuria Musculoskeletal: acute back pain Derm: acute rash Neuro: acute motor dysfunction Psych: worsening mood Endocrine: polydipsia Heme: bleeding Allergy: hayfever  GEN: nad, alert and oriented HEENT: ncat NECK: supple w/o LA CV: rrr. PULM: ctab, no inc wob ABD: soft, +bs EXT: no edema SKIN: no acute rash  The 10-year ASCVD risk score Mikey Bussing DC Jr.,  et al., 2013) is: 0.7%   Values used to calculate the score:     Age: 44 years     Sex: Female     Is Non-Hispanic African American: No     Diabetic: No     Tobacco smoker: No     Systolic Blood Pressure: 762 mmHg     Is BP treated: No     HDL Cholesterol: 64.6 mg/dL     Total Cholesterol: 236 mg/dL

## 2019-09-14 DIAGNOSIS — Z0000 Encounter for general adult medical examination without abnormal findings: Secondary | ICD-10-CM | POA: Insufficient documentation

## 2019-09-14 DIAGNOSIS — Z7189 Other specified counseling: Secondary | ICD-10-CM | POA: Insufficient documentation

## 2019-09-14 NOTE — Assessment & Plan Note (Signed)
Tetanus 2014 Flu 2020 PNA not due Shingles not due covid vaccine 2021 Mammogram 2020 DXA- not due.   Colon screening not due.   Living will d/w pt.  Husband designated if patient were incapacitated.   Diet and exercise d/w pt.  She is working on both with intentional weight loss.  Weight watchers and exercise.  Her exercise tolerance is improved.

## 2019-09-14 NOTE — Assessment & Plan Note (Signed)
Controlled.  Still okay for outpatient follow-up.  Continue with Xanax and Prozac.  Continue counseling.  Continue work on diet and exercise.  She will update me as needed.

## 2019-09-14 NOTE — Assessment & Plan Note (Signed)
Living will d/w pt.  Husband designated if patient were incapacitated.  

## 2019-12-08 ENCOUNTER — Other Ambulatory Visit: Payer: Self-pay | Admitting: Family Medicine

## 2019-12-09 NOTE — Telephone Encounter (Signed)
Refill request Alprazolam Last refill 09/11/19 #90/2 Last office visit 09/11/19

## 2019-12-09 NOTE — Telephone Encounter (Signed)
Sent. Thanks.   

## 2020-03-11 ENCOUNTER — Other Ambulatory Visit: Payer: Self-pay | Admitting: Family Medicine

## 2020-03-11 NOTE — Telephone Encounter (Signed)
Please Advise

## 2020-03-12 NOTE — Telephone Encounter (Signed)
Sent. Thanks.   

## 2020-06-01 ENCOUNTER — Ambulatory Visit: Payer: 59 | Admitting: Family Medicine

## 2020-06-20 ENCOUNTER — Other Ambulatory Visit: Payer: Self-pay | Admitting: Family Medicine

## 2020-06-21 NOTE — Telephone Encounter (Signed)
Refill request Alprazolam Last refill 03/12/20 #90/2 Last office visit 09/11/19 No upcoming appointment scheduled

## 2020-06-22 NOTE — Telephone Encounter (Signed)
Sent. Thanks.   

## 2020-08-23 ENCOUNTER — Other Ambulatory Visit: Payer: Self-pay | Admitting: Family Medicine

## 2020-08-24 ENCOUNTER — Other Ambulatory Visit: Payer: Self-pay | Admitting: Family Medicine

## 2020-08-27 ENCOUNTER — Ambulatory Visit (INDEPENDENT_AMBULATORY_CARE_PROVIDER_SITE_OTHER): Payer: 59 | Admitting: Family Medicine

## 2020-08-27 ENCOUNTER — Other Ambulatory Visit: Payer: Self-pay

## 2020-08-27 ENCOUNTER — Encounter: Payer: Self-pay | Admitting: Family Medicine

## 2020-08-27 VITALS — BP 122/78 | HR 78 | Temp 97.0°F | Ht 68.0 in | Wt 264.0 lb

## 2020-08-27 DIAGNOSIS — Z0000 Encounter for general adult medical examination without abnormal findings: Secondary | ICD-10-CM

## 2020-08-27 DIAGNOSIS — R5383 Other fatigue: Secondary | ICD-10-CM

## 2020-08-27 DIAGNOSIS — E785 Hyperlipidemia, unspecified: Secondary | ICD-10-CM

## 2020-08-27 DIAGNOSIS — F411 Generalized anxiety disorder: Secondary | ICD-10-CM

## 2020-08-27 DIAGNOSIS — Z7189 Other specified counseling: Secondary | ICD-10-CM

## 2020-08-27 LAB — COMPREHENSIVE METABOLIC PANEL
ALT: 35 U/L (ref 0–35)
AST: 29 U/L (ref 0–37)
Albumin: 4.3 g/dL (ref 3.5–5.2)
Alkaline Phosphatase: 121 U/L — ABNORMAL HIGH (ref 39–117)
BUN: 12 mg/dL (ref 6–23)
CO2: 26 mEq/L (ref 19–32)
Calcium: 10 mg/dL (ref 8.4–10.5)
Chloride: 102 mEq/L (ref 96–112)
Creatinine, Ser: 0.81 mg/dL (ref 0.40–1.20)
GFR: 88.26 mL/min (ref >60.00)
Glucose, Bld: 86 mg/dL (ref 70–99)
Potassium: 4.3 mEq/L (ref 3.5–5.1)
Sodium: 139 mEq/L (ref 135–145)
Total Bilirubin: 0.4 mg/dL (ref 0.2–1.2)
Total Protein: 7.2 g/dL (ref 6.0–8.3)

## 2020-08-27 LAB — CBC WITH DIFFERENTIAL/PLATELET
Basophils Absolute: 0.1 10*3/uL (ref 0.0–0.1)
Basophils Relative: 0.7 % (ref 0.0–3.0)
Eosinophils Absolute: 0.4 10*3/uL (ref 0.0–0.7)
Eosinophils Relative: 4.6 % (ref 0.0–5.0)
HCT: 41.6 % (ref 36.0–46.0)
Hemoglobin: 13.9 g/dL (ref 12.0–15.0)
Lymphocytes Relative: 34.2 % (ref 12.0–46.0)
Lymphs Abs: 2.9 10*3/uL (ref 0.7–4.0)
MCHC: 33.5 g/dL (ref 30.0–36.0)
MCV: 88.3 fl (ref 78.0–100.0)
Monocytes Absolute: 0.7 10*3/uL (ref 0.1–1.0)
Monocytes Relative: 8.2 % (ref 3.0–12.0)
Neutro Abs: 4.4 10*3/uL (ref 1.4–7.7)
Neutrophils Relative %: 52.3 % (ref 43.0–77.0)
Platelets: 233 10*3/uL (ref 150.0–400.0)
RBC: 4.71 Mil/uL (ref 3.87–5.11)
RDW: 13.6 % (ref 11.5–15.5)
WBC: 8.3 10*3/uL (ref 4.0–10.5)

## 2020-08-27 LAB — LIPID PANEL
Cholesterol: 264 mg/dL — ABNORMAL HIGH (ref 0–200)
HDL: 74.4 mg/dL (ref >39.00)
LDL Cholesterol: 152 mg/dL — ABNORMAL HIGH (ref 0–99)
NonHDL: 189.94
Total CHOL/HDL Ratio: 4
Triglycerides: 191 mg/dL — ABNORMAL HIGH (ref 0.0–149.0)
VLDL: 38.2 mg/dL (ref 0.0–40.0)

## 2020-08-27 LAB — TSH: TSH: 1.07 u[IU]/mL (ref 0.35–5.50)

## 2020-08-27 LAB — FOLLICLE STIMULATING HORMONE: FSH: 8.2 m[IU]/mL

## 2020-08-27 NOTE — Progress Notes (Signed)
This visit occurred during the SARS-CoV-2 public health emergency.  Safety protocols were in place, including screening questions prior to the visit, additional usage of staff PPE, and extensive cleaning of exam room while observing appropriate contact time as indicated for disinfecting solutions.  CPE- See plan.  Routine anticipatory guidance given to patient.  See health maintenance.  The possibility exists that previously documented standard health maintenance information may have been brought forward from a previous encounter into this note.  If needed, that same information has been updated to reflect the current situation based on today's encounter.    Med f/u.  She had prev gone to counseling and is going to check on follow up.  We talked about stressors and med use.  World/national news have been stressful.  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  Inc weight in the meantime, over the last year.  D/w pt about diet and exercise, she feels better when she is back on diet/exercise.  No SI/HI.    Fatigue noted.  She missed a period about 6 months with return of menses in the meantime. She questioned if she had thyroid contribution to fatigue.    H/o episodic diarrhea w/o blood in stool, no black stools.  This is longstanding.  She'll update me if sx inc and she can monitor diet in the meantime.   Tetanus 2014 Covid vaccine up to date.  PNA and shingles not due . Flu encouraged.  Pap per gyn DXA not due.  She doing to get a f/u mammogram this fall.   Living will d/w pt.  Husband designated if patient were incapacitated.   Diet and exercise d/w pt.    Not fasting today.    PMH and SH reviewed  Meds, vitals, and allergies reviewed.   ROS: Per HPI.  Unless specifically indicated otherwise in HPI, the patient denies:  General: fever. Eyes: acute vision changes ENT: sore throat Cardiovascular: chest pain Respiratory: SOB GI: vomiting GU: dysuria Musculoskeletal: acute back pain Derm:  acute rash Neuro: acute motor dysfunction Psych: worsening mood Endocrine: polydipsia Heme: bleeding Allergy: hayfever  GEN: nad, alert and oriented HEENT: ncat NECK: supple w/o LA CV: rrr. PULM: ctab, no inc wob ABD: soft, +bs EXT: no edema SKIN: no acute rash

## 2020-08-27 NOTE — Patient Instructions (Signed)
Don't change your meds for now.  Take care.  Glad to see you. Go to the lab on the way out.   If you have mychart we'll likely use that to update you.

## 2020-08-31 DIAGNOSIS — R5383 Other fatigue: Secondary | ICD-10-CM | POA: Insufficient documentation

## 2020-08-31 NOTE — Assessment & Plan Note (Signed)
Tetanus 2014 Covid vaccine up to date.  PNA and shingles not due . Flu encouraged.  Pap per gyn DXA not due.  She doing to get a f/u mammogram this fall.   Living will d/w pt.  Husband designated if patient were incapacitated.   Diet and exercise d/w pt.

## 2020-08-31 NOTE — Assessment & Plan Note (Signed)
Living will d/w pt.  Husband designated if patient were incapacitated.  

## 2020-08-31 NOTE — Assessment & Plan Note (Signed)
Would continue with counseling and SSRI/BZD.  Okay for outpatient f/u.  She agrees.

## 2020-08-31 NOTE — Assessment & Plan Note (Signed)
See notes on labs. 

## 2020-09-20 ENCOUNTER — Encounter: Payer: 59 | Admitting: Family Medicine

## 2020-09-21 ENCOUNTER — Other Ambulatory Visit: Payer: Self-pay

## 2020-09-21 NOTE — Telephone Encounter (Signed)
Refill request for Alprazolam 0.25 mg tablets  LOV - 08/27/20 Next OV - not scheduled Last refill - 06/22/20 #90/2

## 2020-09-22 MED ORDER — ALPRAZOLAM 0.25 MG PO TABS: 0.2500 mg | ORAL_TABLET | Freq: Three times a day (TID) | ORAL | 2 refills | Status: DC | PRN

## 2020-09-22 NOTE — Telephone Encounter (Signed)
Sent. Thanks.   

## 2020-11-21 ENCOUNTER — Other Ambulatory Visit: Payer: Self-pay | Admitting: Family Medicine

## 2020-11-25 ENCOUNTER — Other Ambulatory Visit: Payer: Self-pay | Admitting: Family Medicine

## 2020-12-20 ENCOUNTER — Other Ambulatory Visit: Payer: Self-pay | Admitting: Family Medicine

## 2020-12-21 NOTE — Telephone Encounter (Signed)
Refill request for ALPRAZolam (XANAX) 0.25 MG tablet  LOV - 08/27/20 Next OV - not scheduled Last refill - 09/22/20

## 2020-12-22 NOTE — Telephone Encounter (Signed)
Sent. Thanks.   

## 2021-02-21 ENCOUNTER — Other Ambulatory Visit: Payer: Self-pay | Admitting: Family Medicine

## 2021-02-22 MED ORDER — FLUOXETINE HCL 10 MG PO CAPS: 50.0000 mg | ORAL_CAPSULE | Freq: Every day | ORAL | 0 refills | Status: DC

## 2021-03-28 ENCOUNTER — Other Ambulatory Visit: Payer: Self-pay | Admitting: Family Medicine

## 2021-03-28 NOTE — Telephone Encounter (Signed)
Refill request for ALPRAZolam (XANAX) 0.25 MG tablet  LOV - 08/27/20 Next OV - not scheduled Last refill - 12/22/20 #90/2

## 2021-03-29 NOTE — Telephone Encounter (Signed)
Sent. Thanks.   

## 2021-05-20 ENCOUNTER — Other Ambulatory Visit: Payer: Self-pay | Admitting: Family Medicine

## 2021-05-22 ENCOUNTER — Other Ambulatory Visit: Payer: Self-pay | Admitting: Family Medicine

## 2021-05-23 NOTE — Telephone Encounter (Signed)
Please call to schedule CPE for July ?

## 2021-06-27 ENCOUNTER — Other Ambulatory Visit: Payer: Self-pay | Admitting: Family Medicine

## 2021-06-27 NOTE — Telephone Encounter (Signed)
Refill request for ALPRAZOLAM 0.'25MG'$  TABLETS ? ?LOV - 08/27/20 ?Next OV - 09/09/21 ?Last refill - 03/29/21 #90/2 ? ?

## 2021-09-05 ENCOUNTER — Other Ambulatory Visit: Payer: Self-pay | Admitting: Family Medicine

## 2021-09-06 MED ORDER — FLUOXETINE HCL 10 MG PO CAPS
50.0000 mg | ORAL_CAPSULE | Freq: Every day | ORAL | 1 refills | Status: DC
Start: 2021-09-06 — End: 2022-02-24

## 2021-09-06 NOTE — Telephone Encounter (Signed)
Refill request for prozac 10 mg caps  LOV - 08/27/20 Next OV - 09/09/21 Last refill - 05/20/21 #450/0

## 2021-09-09 ENCOUNTER — Ambulatory Visit (INDEPENDENT_AMBULATORY_CARE_PROVIDER_SITE_OTHER): Payer: 59 | Admitting: Family Medicine

## 2021-09-09 ENCOUNTER — Encounter: Payer: Self-pay | Admitting: Family Medicine

## 2021-09-09 VITALS — BP 102/78 | HR 83 | Temp 97.7°F | Ht 68.0 in | Wt 276.0 lb

## 2021-09-09 DIAGNOSIS — F411 Generalized anxiety disorder: Secondary | ICD-10-CM

## 2021-09-09 DIAGNOSIS — Z7189 Other specified counseling: Secondary | ICD-10-CM

## 2021-09-09 DIAGNOSIS — E079 Disorder of thyroid, unspecified: Secondary | ICD-10-CM

## 2021-09-09 DIAGNOSIS — Z0000 Encounter for general adult medical examination without abnormal findings: Secondary | ICD-10-CM | POA: Diagnosis not present

## 2021-09-09 DIAGNOSIS — E785 Hyperlipidemia, unspecified: Secondary | ICD-10-CM

## 2021-09-09 DIAGNOSIS — R059 Cough, unspecified: Secondary | ICD-10-CM

## 2021-09-09 LAB — CBC WITH DIFFERENTIAL/PLATELET
Basophils Absolute: 0.1 10*3/uL (ref 0.0–0.1)
Basophils Relative: 0.6 % (ref 0.0–3.0)
Eosinophils Absolute: 0.5 10*3/uL (ref 0.0–0.7)
Eosinophils Relative: 5.7 % — ABNORMAL HIGH (ref 0.0–5.0)
HCT: 41 % (ref 36.0–46.0)
Hemoglobin: 13.4 g/dL (ref 12.0–15.0)
Lymphocytes Relative: 27.4 % (ref 12.0–46.0)
Lymphs Abs: 2.5 10*3/uL (ref 0.7–4.0)
MCHC: 32.8 g/dL (ref 30.0–36.0)
MCV: 86.5 fl (ref 78.0–100.0)
Monocytes Absolute: 0.5 10*3/uL (ref 0.1–1.0)
Monocytes Relative: 5.9 % (ref 3.0–12.0)
Neutro Abs: 5.6 10*3/uL (ref 1.4–7.7)
Neutrophils Relative %: 60.4 % (ref 43.0–77.0)
Platelets: 239 10*3/uL (ref 150.0–400.0)
RBC: 4.73 Mil/uL (ref 3.87–5.11)
RDW: 14.4 % (ref 11.5–15.5)
WBC: 9.2 10*3/uL (ref 4.0–10.5)

## 2021-09-09 LAB — LIPID PANEL
Cholesterol: 285 mg/dL — ABNORMAL HIGH (ref 0–200)
HDL: 83.2 mg/dL (ref >39.00)
LDL Cholesterol: 177 mg/dL — ABNORMAL HIGH (ref 0–99)
NonHDL: 201.45
Total CHOL/HDL Ratio: 3
Triglycerides: 122 mg/dL (ref 0.0–149.0)
VLDL: 24.4 mg/dL (ref 0.0–40.0)

## 2021-09-09 LAB — COMPREHENSIVE METABOLIC PANEL
ALT: 20 U/L (ref 0–35)
AST: 19 U/L (ref 0–37)
Albumin: 4.2 g/dL (ref 3.5–5.2)
Alkaline Phosphatase: 107 U/L (ref 39–117)
BUN: 9 mg/dL (ref 6–23)
CO2: 24 mEq/L (ref 19–32)
Calcium: 9.7 mg/dL (ref 8.4–10.5)
Chloride: 102 mEq/L (ref 96–112)
Creatinine, Ser: 0.82 mg/dL (ref 0.40–1.20)
GFR: 86.34 mL/min (ref >60.00)
Glucose, Bld: 85 mg/dL (ref 70–99)
Potassium: 4.5 mEq/L (ref 3.5–5.1)
Sodium: 136 mEq/L (ref 135–145)
Total Bilirubin: 0.5 mg/dL (ref 0.2–1.2)
Total Protein: 7 g/dL (ref 6.0–8.3)

## 2021-09-09 LAB — TSH: TSH: 1.11 u[IU]/mL (ref 0.35–5.50)

## 2021-09-09 MED ORDER — DOXYLAMINE SUCCINATE (SLEEP) 25 MG PO TABS: 25.0000 mg | ORAL_TABLET | Freq: Every evening | ORAL | Status: AC | PRN

## 2021-09-09 MED ORDER — DOXYLAMINE SUCCINATE (SLEEP) 25 MG PO TABS: 25.0000 mg | ORAL_TABLET | Freq: Every evening | ORAL | Status: DC | PRN

## 2021-09-09 MED ORDER — BENZONATATE 200 MG PO CAPS: 200.0000 mg | ORAL_CAPSULE | Freq: Three times a day (TID) | ORAL | 1 refills | Status: DC | PRN

## 2021-09-09 NOTE — Progress Notes (Unsigned)
CPE- See plan.  Routine anticipatory guidance given to patient.  See health maintenance.  The possibility exists that previously documented standard health maintenance information may have been brought forward from a previous encounter into this note.  If needed, that same information has been updated to reflect the current situation based on today's encounter.    Tetanus 2014 Covid vaccine up to date.  PNA and shingles not due.  Flu encouraged.  Pap per gyn- she is going to check on follow up 2023.   DXA not due.  She doing to get a f/u mammogram this fall.   Living will d/w pt.  Husband designated if patient were incapacitated.   Diet and exercise d/w pt.    H/o abnormal TSH.  Recheck TSH.  FH Hasimoto's.    Occ lightheaded on standing w/o CP and d/w pt about inc fluid intake.    She had covid twice in the last year.  Now with cough and persistent fatigue from June illness.  Dry cough.  D/w pt about tessalon trial.    Mood d/w pt.  Still on SSRI with PRN BZD.  "I'm doing alright."  Some insomnia, treated with unisom.  We agreed to continue her meds as is for now.  She was asking about counseling options- she couldn't see Edwyna Perfect now.   Meds, vitals, and allergies reviewed.  ROS: Per HPI.  Unless specifically indicated otherwise in HPI, the patient denies:  General: fever. Eyes: acute vision changes ENT: sore throat Cardiovascular: chest pain Respiratory: SOB GI: vomiting GU: dysuria Musculoskeletal: acute back pain Derm: acute rash Neuro: acute motor dysfunction Psych: worsening mood Endocrine: polydipsia Heme: bleeding Allergy: hayfever  GEN: nad, alert and oriented HEENT: ncat NECK: supple w/o LA CV: rrr. PULM: ctab, no inc wob ABD: soft, +bs EXT: no edema SKIN: no acute rash

## 2021-09-09 NOTE — Patient Instructions (Signed)
Go to the lab on the way out.   If you have mychart we'll likely use that to update you.  Take care.  Glad to see you. Try tessalon for the cough.

## 2021-09-11 DIAGNOSIS — R059 Cough, unspecified: Secondary | ICD-10-CM | POA: Insufficient documentation

## 2021-09-11 NOTE — Assessment & Plan Note (Signed)
Tetanus 2014 Covid vaccine up to date.  PNA and shingles not due.  Flu encouraged.  Pap per gyn- she is going to check on follow up 2023.   DXA not due.  She doing to get a f/u mammogram this fall.   Living will d/w pt.  Husband designated if patient were incapacitated.   Diet and exercise d/w pt.

## 2021-09-11 NOTE — Assessment & Plan Note (Signed)
Lungs are clear, likely with postinfectious cough and discussed trying Tessalon and update me as needed.  She agrees.

## 2021-09-11 NOTE — Assessment & Plan Note (Signed)
History of abnormal TSH.  Recheck TSH pending.

## 2021-09-11 NOTE — Assessment & Plan Note (Addendum)
Continue fluoxetine and as needed alprazolam.  I put in the referral for counseling and I am specifically asking for input about a counselor who could help with anxiety and counseling related to food choices/dietary stressors.

## 2021-09-11 NOTE — Assessment & Plan Note (Signed)
Living will d/w pt.  Husband designated if patient were incapacitated.  

## 2021-09-14 ENCOUNTER — Other Ambulatory Visit: Payer: Self-pay | Admitting: Family Medicine

## 2021-09-14 ENCOUNTER — Encounter: Payer: Self-pay | Admitting: Family Medicine

## 2021-09-14 MED ORDER — ALBUTEROL SULFATE HFA 108 (90 BASE) MCG/ACT IN AERS: 1.0000 | INHALATION_SPRAY | Freq: Four times a day (QID) | RESPIRATORY_TRACT | 0 refills | Status: DC | PRN

## 2021-10-02 ENCOUNTER — Other Ambulatory Visit: Payer: Self-pay | Admitting: Family Medicine

## 2021-10-03 NOTE — Telephone Encounter (Signed)
Refill request Alprazolam Last refill 06/28/21 #90/2 Last office visit 09/09/21

## 2021-10-04 NOTE — Telephone Encounter (Signed)
Sent. Thanks.   

## 2021-11-28 ENCOUNTER — Encounter: Payer: Self-pay | Admitting: Family Medicine

## 2021-11-30 ENCOUNTER — Telehealth: Payer: Self-pay | Admitting: Family Medicine

## 2021-11-30 DIAGNOSIS — F411 Generalized anxiety disorder: Secondary | ICD-10-CM

## 2021-11-30 NOTE — Telephone Encounter (Signed)
What happened with her previous psychology referral?  I do not ever recall seeing that it was closed without having been addressed.  Does she need a new referral?  Please let me know.

## 2021-11-30 NOTE — Addendum Note (Signed)
Addended by: Tonia Ghent on: 11/30/2021 08:39 PM   Modules accepted: Orders

## 2021-11-30 NOTE — Telephone Encounter (Signed)
From what I could tell the referral was denied where it was sent to and then I assume nothing else was done.

## 2021-11-30 NOTE — Telephone Encounter (Signed)
I put in a new referral, asking for help here.

## 2021-12-01 NOTE — Telephone Encounter (Signed)
Noted. I had not faxed anything yet to their office - I realized after I sent that response that you stated she was not accepting new patients. I will research a good location to send this referral.   Sorry for any confusion.

## 2021-12-01 NOTE — Telephone Encounter (Signed)
Please see the referral message below.  My understanding is that [name] is not accepting new patients.    ======================= She needs a counselor who could help with anxiety and counseling related to food choices/dietary stressors.   My understanding is that [name] is not accepting new patients.  If there is no one in the [place] area that can help this patient then please notify me.  Do not close the referral without notifying me.  Thanks.

## 2021-12-01 NOTE — Telephone Encounter (Signed)
Noted. Will process the new referral to [facility].

## 2021-12-02 NOTE — Telephone Encounter (Signed)
Noted. Thanks.

## 2021-12-11 NOTE — Telephone Encounter (Signed)
Referral updated and re-routed  ----- Message ----- From: Virl Cagey, CMA Sent: 12/02/2021   9:19 AM EDT To: Tonia Ghent, MD Subject: Referral                                        I am sending this referral to [facility] in [place] as they see a broad spectrum of Psychological Issues to see if this is a referral they can assist with.   Common concerns include:   Depression, mood swings, sleep, appetite changes Anxiety, panic attacks, obsessive thoughts, stress Bipolar disorder and mood disorders Grief, loss and recovery from abuse and trauma Alcohol, drug and other addictive behaviors Schizophrenia, dementia, and less severe problems in thinking such as ADD Adjustment to chronic medical problems such as diabetes, heart disease and chronic pain Relationship problems, parenting concerns, marital and sexual issues, and marriage preparation Adolescent counseling Post traumatic stress disorder

## 2021-12-12 ENCOUNTER — Ambulatory Visit (HOSPITAL_BASED_OUTPATIENT_CLINIC_OR_DEPARTMENT_OTHER): Payer: 59 | Admitting: Obstetrics & Gynecology

## 2021-12-30 ENCOUNTER — Other Ambulatory Visit: Payer: Self-pay | Admitting: Family Medicine

## 2021-12-30 NOTE — Telephone Encounter (Addendum)
See referral notes.  This referral was sent over to [facility] on 12/02/2021   ----- Message ----- From: Virl Cagey, CMA Sent: 12/02/2021   9:19 AM EDT To: Tonia Ghent, MD Subject: Referral                                        I am sending this referral to [facility] in [place] as they see a broad spectrum of Psychological Issues to see if this is a referral they can assist with.

## 2021-12-30 NOTE — Telephone Encounter (Signed)
Refill request Alprazolam Last refill 10/04/21 #90/2 Last office visit 09/09/21

## 2022-02-24 ENCOUNTER — Other Ambulatory Visit: Payer: Self-pay | Admitting: Family Medicine

## 2022-03-28 ENCOUNTER — Other Ambulatory Visit: Payer: Self-pay | Admitting: Family Medicine

## 2022-03-29 NOTE — Telephone Encounter (Signed)
Refill request for ALPRAZOLAM 0.'25MG'$  TABLETS   LOV - 09/09/21 Next OV - not scheduled Last refill - 01/01/22 #90/2

## 2022-06-22 ENCOUNTER — Other Ambulatory Visit: Payer: Self-pay | Admitting: Family Medicine

## 2022-06-23 NOTE — Telephone Encounter (Signed)
Refill request for ALPRAZOLAM 0.25MG  TABLETS   LOV - 09/09/21 Next OV - not scheduled Last refill - 03/29/22 #90/2

## 2022-06-26 ENCOUNTER — Telehealth: Payer: Self-pay

## 2022-06-26 NOTE — Telephone Encounter (Signed)
Patient is due for CPE in July. Please call patient to schedule.

## 2022-06-27 NOTE — Telephone Encounter (Signed)
LVM for patient to cb and sch.  

## 2022-08-14 ENCOUNTER — Other Ambulatory Visit: Payer: Self-pay | Admitting: Family Medicine

## 2022-09-12 ENCOUNTER — Ambulatory Visit (INDEPENDENT_AMBULATORY_CARE_PROVIDER_SITE_OTHER): Payer: 59 | Admitting: Family Medicine

## 2022-09-12 ENCOUNTER — Encounter: Payer: Self-pay | Admitting: Family Medicine

## 2022-09-12 VITALS — BP 122/78 | HR 93 | Temp 98.3°F | Ht 68.0 in | Wt 282.0 lb

## 2022-09-12 DIAGNOSIS — F411 Generalized anxiety disorder: Secondary | ICD-10-CM

## 2022-09-12 DIAGNOSIS — Z0000 Encounter for general adult medical examination without abnormal findings: Secondary | ICD-10-CM | POA: Diagnosis not present

## 2022-09-12 DIAGNOSIS — Z1211 Encounter for screening for malignant neoplasm of colon: Secondary | ICD-10-CM

## 2022-09-12 DIAGNOSIS — Z91018 Allergy to other foods: Secondary | ICD-10-CM

## 2022-09-12 DIAGNOSIS — R5383 Other fatigue: Secondary | ICD-10-CM | POA: Diagnosis not present

## 2022-09-12 DIAGNOSIS — Z7189 Other specified counseling: Secondary | ICD-10-CM

## 2022-09-12 DIAGNOSIS — E785 Hyperlipidemia, unspecified: Secondary | ICD-10-CM

## 2022-09-12 LAB — CBC WITH DIFFERENTIAL/PLATELET
Basophils Absolute: 0 10*3/uL (ref 0.0–0.1)
Basophils Relative: 0.5 % (ref 0.0–3.0)
Eosinophils Absolute: 0.3 10*3/uL (ref 0.0–0.7)
Eosinophils Relative: 3.4 % (ref 0.0–5.0)
HCT: 38.8 % (ref 36.0–46.0)
Hemoglobin: 12.6 g/dL (ref 12.0–15.0)
Lymphocytes Relative: 24.7 % (ref 12.0–46.0)
Lymphs Abs: 2.5 10*3/uL (ref 0.7–4.0)
MCHC: 32.5 g/dL (ref 30.0–36.0)
MCV: 87.6 fl (ref 78.0–100.0)
Monocytes Absolute: 0.6 10*3/uL (ref 0.1–1.0)
Monocytes Relative: 5.6 % (ref 3.0–12.0)
Neutro Abs: 6.6 10*3/uL (ref 1.4–7.7)
Neutrophils Relative %: 65.8 % (ref 43.0–77.0)
Platelets: 234 10*3/uL (ref 150.0–400.0)
RBC: 4.43 Mil/uL (ref 3.87–5.11)
RDW: 14.1 % (ref 11.5–15.5)
WBC: 10 10*3/uL (ref 4.0–10.5)

## 2022-09-12 LAB — COMPREHENSIVE METABOLIC PANEL
ALT: 16 U/L (ref 0–35)
AST: 18 U/L (ref 0–37)
Albumin: 4 g/dL (ref 3.5–5.2)
Alkaline Phosphatase: 94 U/L (ref 39–117)
BUN: 9 mg/dL (ref 6–23)
CO2: 25 mEq/L (ref 19–32)
Calcium: 9.5 mg/dL (ref 8.4–10.5)
Chloride: 101 mEq/L (ref 96–112)
Creatinine, Ser: 0.69 mg/dL (ref 0.40–1.20)
GFR: 104.02 mL/min (ref >60.00)
Glucose, Bld: 84 mg/dL (ref 70–99)
Potassium: 4.4 mEq/L (ref 3.5–5.1)
Sodium: 134 mEq/L — ABNORMAL LOW (ref 135–145)
Total Bilirubin: 0.5 mg/dL (ref 0.2–1.2)
Total Protein: 6.9 g/dL (ref 6.0–8.3)

## 2022-09-12 LAB — LIPID PANEL
Cholesterol: 278 mg/dL — ABNORMAL HIGH (ref 0–200)
HDL: 88.7 mg/dL (ref >39.00)
LDL Cholesterol: 162 mg/dL — ABNORMAL HIGH (ref 0–99)
NonHDL: 189.52
Total CHOL/HDL Ratio: 3
Triglycerides: 136 mg/dL (ref 0.0–149.0)
VLDL: 27.2 mg/dL (ref 0.0–40.0)

## 2022-09-12 LAB — TSH: TSH: 1.16 u[IU]/mL (ref 0.35–5.50)

## 2022-09-12 LAB — VITAMIN B12: Vitamin B-12: 176 pg/mL — ABNORMAL LOW (ref 211–911)

## 2022-09-12 MED ORDER — EPINEPHRINE 0.3 MG/0.3ML IJ SOAJ: 0.3000 mg | INTRAMUSCULAR | 1 refills | Status: AC | PRN

## 2022-09-12 NOTE — Patient Instructions (Signed)
Go to the lab on the way out.   If you have mychart we'll likely use that to update you.    Take care.  Glad to see you. Let me see about med options in the meantime.  I would get a flu shot each fall.

## 2022-09-12 NOTE — Progress Notes (Unsigned)
CPE- See plan.  Routine anticipatory guidance given to patient.  See health maintenance.  The possibility exists that previously documented standard health maintenance information may have been brought forward from a previous encounter into this note.  If needed, that same information has been updated to reflect the current situation based on today's encounter.    Tetanus 2014 Covid vaccine up to date.  PNA and shingles not due.  Flu encouraged.  Pap per gyn DXA not due.  Mammogram per gynecology- prev through Dr. Ernestina Penna.   Living will d/w pt.  Husband designated if patient were incapacitated.   Diet and exercise d/w pt.   D/w patient [id number] for colon cancer screening, including IFOB vs. colonoscopy.  Risks and benefits of both were discussed and patient voiced understanding.  Pt elects for: colonoscopy.  Referred 2024.    Her L shoulder ROM is better in the meantime.    Fatigue.  Still taking prozac 50mg  daily.  Mood d/w pt.  Recheck labs pending.  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  We talked about checking labs prior to adjusting her medication, ie prozac.  She is trying to work on diet and exercise.    [place] allergy d/w pt.  She had throat symptoms, ie tightness that improved with benadryl.  Cautions d/w pt.  Epipen cautions d/w pt.    She is helping with [employer].   Teaching preschool as of 2024.    PMH and SH reviewed  Meds, vitals, and allergies reviewed.   ROS: Per HPI.  Unless specifically indicated otherwise in HPI, the patient denies:  General: fever. Eyes: acute vision changes ENT: sore throat Cardiovascular: chest pain Respiratory: SOB GI: vomiting GU: dysuria Musculoskeletal: acute back pain Derm: acute rash Neuro: acute motor dysfunction Psych: worsening mood Endocrine: polydipsia Heme: bleeding Allergy: hayfever  GEN: nad, alert and oriented HEENT: mucous membranes  moist NECK: supple w/o LA CV: rrr. PULM: ctab, no inc wob ABD: soft, +bs EXT: no edema SKIN: no acute rash

## 2022-09-13 ENCOUNTER — Other Ambulatory Visit: Payer: Self-pay | Admitting: Family Medicine

## 2022-09-13 DIAGNOSIS — E538 Deficiency of other specified B group vitamins: Secondary | ICD-10-CM | POA: Insufficient documentation

## 2022-09-13 DIAGNOSIS — Z91018 Allergy to other foods: Secondary | ICD-10-CM | POA: Insufficient documentation

## 2022-09-13 MED ORDER — CYANOCOBALAMIN 1000 MCG/ML IJ SOLN
INTRAMUSCULAR | Status: DC
Start: 2022-09-13 — End: 2022-12-25

## 2022-09-13 NOTE — Assessment & Plan Note (Signed)
Epipen cautions d/w pt.

## 2022-09-13 NOTE — Assessment & Plan Note (Signed)
With fatigue.  Still taking prozac 50mg  daily.  Mood d/w pt.  Recheck labs pending.  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  We talked about checking labs prior to adjusting her medication, ie prozac.  She is trying to work on diet and exercise.  See notes on labs.

## 2022-09-13 NOTE — Assessment & Plan Note (Signed)
Living will d/w pt.  Husband designated if patient were incapacitated.  

## 2022-09-13 NOTE — Assessment & Plan Note (Signed)
Tetanus 2014 Covid vaccine prev done  PNA and shingles not due.  Flu encouraged.  Pap per gyn DXA not due.  Mammogram per gynecology- prev through Dr. Ernestina Penna.   Living will d/w pt.  Husband designated if patient were incapacitated.   Diet and exercise d/w pt.   D/w patient [id number] for colon cancer screening, including IFOB vs. colonoscopy.  Risks and benefits of both were discussed and patient voiced understanding.  Pt elects for: colonoscopy.  Referred 2024.

## 2022-09-15 ENCOUNTER — Telehealth: Payer: Self-pay | Admitting: Family Medicine

## 2022-09-15 NOTE — Telephone Encounter (Signed)
Patient returned call from 09/14/2022 regarding lab would like a call back.

## 2022-09-15 NOTE — Telephone Encounter (Signed)
Spoke with patient about lab results.

## 2022-10-01 ENCOUNTER — Other Ambulatory Visit: Payer: Self-pay | Admitting: Family Medicine

## 2022-10-02 NOTE — Telephone Encounter (Signed)
Refill request for ALPRAZolam (XANAX) 0.25 MG tablet   LOV - 09/12/22 Next OV - not scheduled Last refill - 06/25/22 #90/2

## 2022-11-08 ENCOUNTER — Other Ambulatory Visit: Payer: Self-pay | Admitting: Family Medicine

## 2022-12-22 ENCOUNTER — Other Ambulatory Visit (INDEPENDENT_AMBULATORY_CARE_PROVIDER_SITE_OTHER): Payer: 59

## 2022-12-22 DIAGNOSIS — E538 Deficiency of other specified B group vitamins: Secondary | ICD-10-CM

## 2022-12-22 LAB — VITAMIN B12: Vitamin B-12: 780 pg/mL (ref 211–911)

## 2022-12-25 ENCOUNTER — Other Ambulatory Visit: Payer: Self-pay | Admitting: Family Medicine

## 2022-12-25 DIAGNOSIS — E538 Deficiency of other specified B group vitamins: Secondary | ICD-10-CM

## 2022-12-25 MED ORDER — CYANOCOBALAMIN 1000 MCG/ML IJ SOLN
INTRAMUSCULAR | Status: DC
Start: 2022-12-25 — End: 2023-10-08

## 2022-12-29 ENCOUNTER — Other Ambulatory Visit: Payer: Self-pay | Admitting: Family Medicine

## 2023-01-01 NOTE — Telephone Encounter (Signed)
Prescription Request  01/01/2023  LOV: 09/12/2022  What is the name of the medication or equipment? ALPRAZolam (XANAX) 0.25 MG tablet   Have you contacted your pharmacy to request a refill? Yes   Which pharmacy would you like this sent to?  [id number] - [place], [place] - [address] [place] [address] Phone: [phone] Fax: [phone]    Patient notified that their request is being sent to the clinical staff for review and that they should receive a response within 2 business days.   Please advise at Mobile [phone] (mobile)

## 2023-01-02 ENCOUNTER — Encounter: Payer: Self-pay | Admitting: Family Medicine

## 2023-01-02 NOTE — Telephone Encounter (Signed)
Spoke with pt notifying her refill was sent. Pt expresses her thanks.

## 2023-01-02 NOTE — Telephone Encounter (Signed)
Pt called checking status of med refill? Pt states she is now out of meds. Call back # [phone]

## 2023-01-02 NOTE — Telephone Encounter (Signed)
LAST APPOINTMENT DATE: 09/12/22   NEXT APPOINTMENT DATE: Visit date not found    LAST REFILL: 10/03/22  QTY: #90 2 rf   Patient original message was sent to [facility] poll Friday. They have sent my chart as well as phone call to follow up on this.

## 2023-01-02 NOTE — Telephone Encounter (Signed)
ERx  Plz notify this was sent in to pharmacy

## 2023-01-03 NOTE — Telephone Encounter (Signed)
Thanks

## 2023-01-30 ENCOUNTER — Other Ambulatory Visit: Payer: Self-pay | Admitting: Family Medicine

## 2023-01-30 NOTE — Telephone Encounter (Signed)
Name of Medication: ALPRAZOLAM 0.25MG  TABLETS Name of Pharmacy: [facility]  Last Fill 01/02/23 #90  Last Office Visit and Type: cpe 09/12/22

## 2023-02-18 ENCOUNTER — Other Ambulatory Visit: Payer: Self-pay | Admitting: Family Medicine

## 2023-02-19 MED ORDER — FLUOXETINE HCL 10 MG PO CAPS: 10.0000 mg | ORAL_CAPSULE | Freq: Every day | ORAL | 1 refills | Status: DC

## 2023-03-07 ENCOUNTER — Encounter: Payer: Self-pay | Admitting: Family Medicine

## 2023-03-07 ENCOUNTER — Other Ambulatory Visit: Payer: Self-pay | Admitting: Family Medicine

## 2023-03-07 MED ORDER — FLUOXETINE HCL 10 MG PO CAPS: 50.0000 mg | ORAL_CAPSULE | Freq: Every day | ORAL | 1 refills | Status: DC

## 2023-03-07 NOTE — Telephone Encounter (Signed)
 Please check with pharmacy to make sure she was able to get the fluoxetine  prescription filled so that she can take a total of 50 mg/day.  If they cannot fill it with 10 mg pills, see if the pharmacy can fill a combination of 40mg  and 10mg  pills.  Please let me know. Thanks.

## 2023-03-08 NOTE — Telephone Encounter (Signed)
 [name], Mary L, CMA to Me      03/08/23  4:50 PM Spoke with pharmacy and they advised that they did receive the new rx and that they did not have it all on hand. So they filled partial and they told her that in the next couple of days when the shipment comes in they will fill the rest.

## 2023-04-30 ENCOUNTER — Other Ambulatory Visit: Payer: Self-pay | Admitting: Family Medicine

## 2023-05-16 LAB — HM PAP SMEAR

## 2023-06-07 ENCOUNTER — Encounter: Payer: Self-pay | Admitting: Family Medicine

## 2023-07-13 ENCOUNTER — Emergency Department (HOSPITAL_BASED_OUTPATIENT_CLINIC_OR_DEPARTMENT_OTHER): Admission: EM | Admit: 2023-07-13 | Discharge: 2023-07-13 | Disposition: A | Discharge status: Home / Self Care

## 2023-07-13 ENCOUNTER — Other Ambulatory Visit: Payer: Self-pay

## 2023-07-13 DIAGNOSIS — R1011 Right upper quadrant pain: Secondary | ICD-10-CM | POA: Insufficient documentation

## 2023-07-13 DIAGNOSIS — R1013 Epigastric pain: Secondary | ICD-10-CM

## 2023-07-13 DIAGNOSIS — R197 Diarrhea, unspecified: Secondary | ICD-10-CM | POA: Insufficient documentation

## 2023-07-13 DIAGNOSIS — R1012 Left upper quadrant pain: Secondary | ICD-10-CM | POA: Insufficient documentation

## 2023-07-13 DIAGNOSIS — Z7982 Long term (current) use of aspirin: Secondary | ICD-10-CM | POA: Insufficient documentation

## 2023-07-13 DIAGNOSIS — D72829 Elevated white blood cell count, unspecified: Secondary | ICD-10-CM | POA: Insufficient documentation

## 2023-07-13 LAB — COMPREHENSIVE METABOLIC PANEL WITH GFR
ALT: 18 U/L (ref 0–44)
AST: 20 U/L (ref 15–41)
Albumin: 4.1 g/dL (ref 3.5–5.0)
Alkaline Phosphatase: 111 U/L (ref 38–126)
Anion gap: 14 (ref 5–15)
BUN: 9 mg/dL (ref 6–20)
CO2: 20 mmol/L — ABNORMAL LOW (ref 22–32)
Calcium: 9.4 mg/dL (ref 8.9–10.3)
Chloride: 103 mmol/L (ref 98–111)
Creatinine, Ser: 0.78 mg/dL (ref 0.44–1.00)
GFR, Estimated: >60 mL/min (ref >60)
Glucose, Bld: 91 mg/dL (ref 70–99)
Potassium: 4.1 mmol/L (ref 3.5–5.1)
Sodium: 137 mmol/L (ref 135–145)
Total Bilirubin: 0.3 mg/dL (ref 0.0–1.2)
Total Protein: 7.7 g/dL (ref 6.5–8.1)

## 2023-07-13 LAB — CBC
HCT: 40.7 % (ref 36.0–46.0)
Hemoglobin: 13.3 g/dL (ref 12.0–15.0)
MCH: 27.8 pg (ref 26.0–34.0)
MCHC: 32.7 g/dL (ref 30.0–36.0)
MCV: 85.1 fL (ref 80.0–100.0)
Platelets: 258 10*3/uL (ref 150–400)
RBC: 4.78 MIL/uL (ref 3.87–5.11)
RDW: 14.7 % (ref 11.5–15.5)
WBC: 13 10*3/uL — ABNORMAL HIGH (ref 4.0–10.5)
nRBC: 0 % (ref 0.0–0.2)

## 2023-07-13 LAB — URINALYSIS, ROUTINE W REFLEX MICROSCOPIC
Bilirubin Urine: NEGATIVE
Glucose, UA: NEGATIVE mg/dL
Hgb urine dipstick: NEGATIVE
Ketones, ur: NEGATIVE mg/dL
Nitrite: NEGATIVE
Protein, ur: NEGATIVE mg/dL
Specific Gravity, Urine: 1.009 (ref 1.005–1.030)
pH: 5.5 (ref 5.0–8.0)

## 2023-07-13 LAB — LIPASE, BLOOD: Lipase: 25 U/L (ref 11–51)

## 2023-07-13 LAB — PREGNANCY, URINE: Preg Test, Ur: NEGATIVE

## 2023-07-13 MED ORDER — LIDOCAINE VISCOUS HCL 2 % MT SOLN
15.0000 mL | Freq: Once | OROMUCOSAL | Status: AC
  Administered 2023-07-13: 15 mL via ORAL
  Filled 2023-07-13: qty 15

## 2023-07-13 MED ORDER — ALUM & MAG HYDROXIDE-SIMETH 200-200-20 MG/5ML PO SUSP
30.0000 mL | Freq: Once | ORAL | Status: AC
  Administered 2023-07-13: 30 mL via ORAL
  Filled 2023-07-13: qty 30

## 2023-07-13 MED ORDER — HYOSCYAMINE SULFATE 0.125 MG SL SUBL: 0.1250 mg | SUBLINGUAL_TABLET | SUBLINGUAL | 0 refills | Status: DC | PRN

## 2023-07-13 MED ORDER — LOPERAMIDE HCL 2 MG PO CAPS
2.0000 mg | ORAL_CAPSULE | Freq: Once | ORAL | Status: AC
  Administered 2023-07-13: 2 mg via ORAL
  Filled 2023-07-13: qty 1

## 2023-07-13 MED ORDER — DICYCLOMINE HCL 10 MG PO CAPS
20.0000 mg | ORAL_CAPSULE | Freq: Once | ORAL | Status: AC
  Administered 2023-07-13: 20 mg via ORAL
  Filled 2023-07-13: qty 2

## 2023-07-13 NOTE — ED Triage Notes (Signed)
 Pt caox4 ambulatory c/o generalized abd pain with diarrhea x1 wk.

## 2023-07-13 NOTE — ED Provider Notes (Signed)
  EMERGENCY DEPARTMENT AT [facility] Provider Note   CSN: [id number] Arrival date & time: 07/13/23  1324     History  Chief Complaint  Patient presents with   Abdominal Pain    Mary [name] is a 48 y.o. female who presents emergency department with loose stools and abdominal pain x 1 week.  Patient reports a longstanding history of untreated reflux symptoms and "stomach problems."  She has seen GI in the past and was told that it was her "anxiety.  She reports she began having loose pale-colored stools.  She has bowel movements every time she eats.She has had problems with this intermittently her whole life. She came today because her husband is going out of town for a week and she has had some persistent pain in the left and upper right quadrants of her abdomen which she describes as sharp and catchy.  Had to go to an urgent care but was sent to the emergency department.   Abdominal Pain      Home Medications Prior to Admission medications   Medication Sig Start Date End Date Taking? Authorizing Provider  ALPRAZolam  (XANAX ) 0.25 MG tablet TAKE 1 TABLET(0.25 MG) BY MOUTH THREE TIMES DAILY AS NEEDED FOR ANXIETY 05/02/23   Donnie Galea, MD  aspirin 81 MG chewable tablet Chew 81 mg by mouth once. 06/28/22   [provider]  calcium carbonate (TUMS EX) 750 MG chewable tablet Chew 1 tablet by mouth daily as needed for heartburn.    [provider]  cyanocobalamin  (VITAMIN B12) 1000 MCG/ML injection 1000mcg IM monthly. 12/25/22   Donnie Galea, MD  doxylamine , Sleep, (UNISOM ) 25 MG tablet Take 1 tablet (25 mg total) by mouth at bedtime as needed for sleep. 09/09/21   Donnie Galea, MD  EPINEPHrine  (EPIPEN  2-PAK) 0.3 mg/0.3 mL IJ SOAJ injection Inject 0.3 mg into the muscle as needed for anaphylaxis. 09/12/22   Donnie Galea, MD  FLUoxetine  (PROZAC ) 10 MG capsule Take 5 capsules (50 mg total) by mouth daily. 03/07/23   Donnie Galea, MD   Multiple Vitamins-Minerals (MULTIVITAL-M PO) Take by mouth.    [provider]      Allergies    Avocado, Doxycycline , Lamictal [lamotrigine], Penicillins, Sulfonamide derivatives, and Sertraline hcl    Review of Systems   Review of Systems  Gastrointestinal:  Positive for abdominal pain.    Physical Exam Updated Vital Signs BP (!) 144/73 (BP Location: Right Arm)   Pulse 92   Temp 98.8 F (37.1 C)   Resp 18   Ht 5\' 8"  (1.727 m)   Wt 129.3 kg   LMP 06/21/2023 (Exact Date)   SpO2 99%   BMI 43.33 kg/m  Physical Exam Vitals and nursing note reviewed.  Constitutional:      General: She is not in acute distress.    Appearance: She is well-developed. She is not diaphoretic.  HENT:     Head: Normocephalic and atraumatic.     Right Ear: External ear normal.     Left Ear: External ear normal.     Nose: Nose normal.     Mouth/Throat:     Mouth: Mucous membranes are moist.  Eyes:     General: No scleral icterus.    Conjunctiva/sclera: Conjunctivae normal.  Cardiovascular:     Rate and Rhythm: Normal rate and regular rhythm.     Heart sounds: Normal heart sounds. No murmur heard.    No friction rub. No gallop.  Pulmonary:  Effort: Pulmonary effort is normal. No respiratory distress.     Breath sounds: Normal breath sounds.  Abdominal:     General: Bowel sounds are normal. There is no distension.     Palpations: Abdomen is soft. There is no mass.     Tenderness: There is no abdominal tenderness. There is no right CVA tenderness, left CVA tenderness, guarding or rebound.  Musculoskeletal:     Cervical back: Normal range of motion.  Skin:    General: Skin is warm and dry.  Neurological:     Mental Status: She is alert and oriented to person, place, and time.  Psychiatric:        Behavior: Behavior normal.     ED Results / Procedures / Treatments   Labs (all labs ordered are listed, but only abnormal results are displayed) Labs Reviewed  COMPREHENSIVE  METABOLIC PANEL WITH GFR - Abnormal; Notable for the following components:      Result Value   CO2 20 (*)    All other components within normal limits  CBC - Abnormal; Notable for the following components:   WBC 13.0 (*)    All other components within normal limits  URINALYSIS, ROUTINE W REFLEX MICROSCOPIC - Abnormal; Notable for the following components:   Leukocytes,Ua LARGE (*)    Bacteria, UA FEW (*)    All other components within normal limits  LIPASE, BLOOD  PREGNANCY, URINE    EKG EKG Interpretation Date/Time:  Friday Jul 13 2023 14:04:57 EDT Ventricular Rate:  93 PR Interval:  138 QRS Duration:  80 QT Interval:  356 QTC Calculation: 442 R Axis:   45  Text Interpretation: Normal sinus rhythm Right atrial enlargement Borderline ECG No previous ECGs available Confirmed by Elise Guile [phone]) on 07/13/2023 3:44:40 PM  Radiology No results found.  Procedures Procedures    Medications Ordered in ED Medications - No data to display  ED Course/ Medical Decision Making/ A&P Clinical Course as of 07/13/23 1553  Fri Jul 13, 2023  1553 Leukocytes,Ua(!): LARGE [AH]  1553 Bacteria, UA(!): FEW [AH]  1553 WBC, UA: 21-50 [AH]    Clinical Course User Index [AH] Tama Fails, PA-C                                 Medical Decision Making Amount and/or Complexity of Data Reviewed Labs: ordered. Decision-making details documented in ED Course. Radiology: ordered.  Risk OTC drugs. Prescription drug management.   48 year old female who presents emergency department for diarrhea right and left upper quadrant abdominal pain which is intermittent catchy.  Differential diagnosis includes gastroenteritis, constipation, gas, GERD, IBS most likely.  Differential also includes infectious origin llama Tory process including infectious diarrhea.  All of these are a lot less likely.  She has a very benign abdominal exam without any tenderness on examination.  Consider right upper  quadrant abdominal ultrasound however given her normal labs and lack of tenderness in the right upper quadrant I have low suspicion that she has underlying biliary colic.  Is low risk for C. 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  She does have a slightly elevated white blood cell count which could be due to acute phase patient.  Urine shows large leukocytes few bacteria.  She is not having any urinary symptoms however sent for culture.  Patient feeling improved after medications here in the ER. Patient appears safe  for dc with GI follow up.        Final Clinical Impression(s) / ED Diagnoses Final diagnoses:  None    Rx / DC Orders ED Discharge Orders     None         Tama Fails, PA-C 07/16/23 1450    Rolinda Climes, DO 07/16/23 1559

## 2023-07-13 NOTE — Discharge Instructions (Addendum)
 Take 20 mg pepcid  daily Take levsin for stomach cramping. Follow with Dr. Tova Fresh  Get help right away if: You cannot stop vomiting. Your pain is only in one part of the abdomen. Pain on the right side could be caused by appendicitis. You have bloody or black poop (stool), or poop that looks like tar. You have trouble breathing. You have chest pain. These symptoms may be an emergency. Get help right away. Call 911. Do not wait to see if the symptoms will go away. Do not drive yourself to the hospital.

## 2023-07-16 LAB — URINE CULTURE

## 2023-07-17 ENCOUNTER — Telehealth (HOSPITAL_BASED_OUTPATIENT_CLINIC_OR_DEPARTMENT_OTHER): Payer: Self-pay

## 2023-07-17 ENCOUNTER — Telehealth: Payer: Self-pay

## 2023-07-17 NOTE — Transitions of Care (Post Inpatient/ED Visit) (Signed)
 Pt  was seen at [facility] ED for left upper abd pain ands diarrhea on and off for "awhile". pt said could not find reason for pain. pepcid  was prescribed butpt has not been taking pepcid . pt changed her diet on her own is eating lean protein and fruits and vegetables.and no coffee that seems to help. Pt is feeling better if watches diet pt already had colonoscopy scheduled for 07/25/23 with Dr Tova Fresh GI. Pt said would FU with GI to see if that will explain the pain and if not pt will call and schedule appt with Dr Vallarie Gauze. UC & ED precautions given and pt voiced understanding. Sending note to Dr Vallarie Gauze.         07/17/2023  Name: Mary [name] MRN: [id number] DOB: 03/25/1975  Today's TOC FU Call Status: Today's TOC FU Call Status:: Successful TOC FU Call Completed TOC FU Call Complete Date: 07/17/23 Patient's Name and Date of Birth confirmed.  Transition Care Management Follow-up Telephone Call Date of Discharge: 07/13/23 Discharge Facility: [facility] Type of Discharge: Emergency Department Reason for ED Visit: Other: (left upper abd pain on and off for "awhile". pt said could not find reason for pain. pepcid  was prescribed butpt has not been taking pepcid . pt is eating lean protein and fruits and vegetables.and no coffee that sewems to help.) How have you been since you were released from the hospital?: Better Any questions or concerns?: No  Items Reviewed: Did you receive and understand the discharge instructions provided?: Yes Medications obtained,verified, and reconciled?: Partial Review Completed Reason for Partial Mediation Review: pt reviewed med given at ED but pt isnot taking the pepcid  at this time; ptmodified her diet. Any new allergies since your discharge?: No Dietary orders reviewed?: Yes Type of Diet Ordered:: pt has been eating more fruits and vegetables and staying away from dairy. Do you have support at home?: Yes People in Home [RPT]: spouse Name  of Support/Comfort Primary Source: lee  Medications Reviewed Today: Medications Reviewed Today   Medications were not reviewed in this encounter     Home Care and Equipment/Supplies: Were Home Health Services Ordered?: NA Any new equipment or medical supplies ordered?: NA  Functional Questionnaire: Do you need assistance with bathing/showering or dressing?: No Do you need assistance with meal preparation?: No Do you need assistance with eating?: No Do you have difficulty maintaining continence: No Do you need assistance with getting out of bed/getting out of a chair/moving?: No Do you have difficulty managing or taking your medications?: No  Follow up appointments reviewed: PCP Follow-up appointment confirmed?: NA Specialist Hospital Follow-up appointment confirmed?: Yes Date of Specialist follow-up appointment?: 07/25/23 Follow-Up Specialty Provider:: Dr Tova Fresh GI Do you need transportation to your follow-up appointment?: No Do you understand care options if your condition(s) worsen?: Yes-patient verbalized understanding    SIGNATURE Claretha Crocker, LPN

## 2023-07-17 NOTE — Telephone Encounter (Signed)
 Post ED Visit - Positive Culture Follow-up  Culture report reviewed by antimicrobial stewardship pharmacist: [facility] [x]  Trinidad Funk, Pharm.D. []  Skeet Duke, Pharm.D., BCPS AQ-ID []  Leslee Rase, Pharm.D., BCPS []  Garland Junk, Pharm.D., BCPS []  [place], [address].D., BCPS, AAHIVP []  Alcide Aly, Pharm.D., BCPS, AAHIVP []  Jerri Morale, PharmD, BCPS []  Graham Laws, PharmD, BCPS []  Cleda Curly, PharmD, BCPS []  Tamar Fairly, PharmD []  Ballard Levels, PharmD, BCPS []  Ollen Beverage, PharmD  [facility] []  Arlyne Bering, PharmD []  Sherryle Don, PharmD []  Van Gelinas, PharmD []  Delila Felty, Rph []  [place]) Cleora Daft, PharmD []  Augustina Block, PharmD []  Arie Kurtz, PharmD []  Sharlyn Deaner, PharmD []  Agnes Hose, PharmD []  Kendall Pauls, PharmD []  Gladstone Lamer, PharmD []  Armanda Bern, PharmD []  Tera Fellows, PharmD   Positive urine culture No further patient follow-up is required at this time.  CC: abdominal pain, loose stools x 7 days.  No urinary sx, no bacteruria.  Zeb Heys 07/17/2023, 10:10 AM

## 2023-07-17 NOTE — Telephone Encounter (Signed)
 Noted. Thanks.

## 2023-07-25 LAB — HM COLONOSCOPY

## 2023-07-31 ENCOUNTER — Other Ambulatory Visit: Payer: Self-pay | Admitting: Family Medicine

## 2023-07-31 NOTE — Telephone Encounter (Signed)
 LOV:09/12/22 NOV: NOTHING SCHEDULED LAST REFILL:  ALPRAZOLAM  0.25MG  TABLETS  05/02/23 90 tablets 2 refills

## 2023-08-01 NOTE — Telephone Encounter (Signed)
 Sent. Thanks.

## 2023-09-04 ENCOUNTER — Other Ambulatory Visit: Payer: Self-pay | Admitting: Family Medicine

## 2023-09-04 DIAGNOSIS — E785 Hyperlipidemia, unspecified: Secondary | ICD-10-CM

## 2023-09-04 DIAGNOSIS — E538 Deficiency of other specified B group vitamins: Secondary | ICD-10-CM

## 2023-09-04 NOTE — Telephone Encounter (Signed)
 Pt is due for her CPE (labs prior if possible) on or after 09/13/23, please schedule then route back to [facility] for refill

## 2023-09-04 NOTE — Telephone Encounter (Signed)
 LVM to schedule for CPE and Labs on or after 09/13/23

## 2023-09-12 NOTE — Telephone Encounter (Signed)
 Patient is scheduled for 10/08/23

## 2023-09-13 NOTE — Telephone Encounter (Signed)
 Rx sent.   Labs ordered.

## 2023-10-08 ENCOUNTER — Encounter: Payer: Self-pay | Admitting: Family Medicine

## 2023-10-08 ENCOUNTER — Ambulatory Visit: Admitting: Family Medicine

## 2023-10-08 VITALS — BP 124/78 | HR 87 | Temp 98.9°F | Ht 68.9 in | Wt 283.4 lb

## 2023-10-08 DIAGNOSIS — E785 Hyperlipidemia, unspecified: Secondary | ICD-10-CM | POA: Diagnosis not present

## 2023-10-08 DIAGNOSIS — E538 Deficiency of other specified B group vitamins: Secondary | ICD-10-CM

## 2023-10-08 DIAGNOSIS — F411 Generalized anxiety disorder: Secondary | ICD-10-CM

## 2023-10-08 DIAGNOSIS — Z7189 Other specified counseling: Secondary | ICD-10-CM

## 2023-10-08 DIAGNOSIS — Z0000 Encounter for general adult medical examination without abnormal findings: Secondary | ICD-10-CM

## 2023-10-08 MED ORDER — FLUOXETINE HCL 40 MG PO CAPS: 40.0000 mg | ORAL_CAPSULE | Freq: Every day | ORAL | 3 refills | Status: AC

## 2023-10-08 MED ORDER — FLUOXETINE HCL 40 MG PO CAPS: 40.0000 mg | ORAL_CAPSULE | Freq: Every day | ORAL | 3 refills | Status: DC

## 2023-10-08 NOTE — Patient Instructions (Signed)
 I would get a flu shot each fall.   Take care.  Glad to see you. Go to the lab on the way out.   If you have mychart we'll likely use that to update you.    Keep taking 40mg  prozac .

## 2023-10-08 NOTE — Progress Notes (Signed)
 CPE- See plan.  Routine anticipatory guidance given to patient.  See health maintenance.  The possibility exists that previously documented standard health maintenance information may have been brought forward from a previous encounter into this note.  If needed, that same information has been updated to reflect the current situation based on today's encounter.    Tetanus 2025 at pharmacy 11/02/22. Covid vaccine prev done  PNA and shingles not due.  Flu encouraged.  Pap per gyn DXA not due.  Mammogram per gynecology- Dr. Estelle.  Living will d/w pt.  Husband designated if patient were incapacitated.   Diet and exercise d/w pt.   Colonoscopy 07/25/23 with 10 year f/u.   Labs pending.  Anxiety.  She had been taking 40mg  prozac  with prn xanax .  D/w pt about 50mg  dosing- she had similar effect with 40mg  and we agreed to continue that dose.  No SI/HI.  Her grandmother died this year, condolences offered.    PMH and SH reviewed  Meds, vitals, and allergies reviewed.   ROS: Per HPI.  Unless specifically indicated otherwise in HPI, the patient denies:  General: fever. Eyes: acute vision changes ENT: sore throat Cardiovascular: chest pain Respiratory: SOB GI: vomiting GU: dysuria Musculoskeletal: acute back pain Derm: acute rash Neuro: acute motor dysfunction Psych: worsening mood Endocrine: polydipsia Heme: bleeding Allergy: hayfever  GEN: nad, alert and oriented HEENT: mucous membranes moist NECK: supple w/o LA CV: rrr. PULM: ctab, no inc wob ABD: soft, +bs EXT: no edema SKIN: no acute rash

## 2023-10-09 LAB — LIPID PANEL
Cholesterol: 293 mg/dL — ABNORMAL HIGH (ref 0–200)
HDL: 77.1 mg/dL (ref >39.00)
LDL Cholesterol: 175 mg/dL — ABNORMAL HIGH (ref 0–99)
NonHDL: 215.43
Total CHOL/HDL Ratio: 4
Triglycerides: 201 mg/dL — ABNORMAL HIGH (ref 0.0–149.0)
VLDL: 40.2 mg/dL — ABNORMAL HIGH (ref 0.0–40.0)

## 2023-10-09 LAB — COMPREHENSIVE METABOLIC PANEL WITH GFR
ALT: 19 U/L (ref 0–35)
AST: 17 U/L (ref 0–37)
Albumin: 4.1 g/dL (ref 3.5–5.2)
Alkaline Phosphatase: 106 U/L (ref 39–117)
BUN: 7 mg/dL (ref 6–23)
CO2: 24 meq/L (ref 19–32)
Calcium: 8.9 mg/dL (ref 8.4–10.5)
Chloride: 103 meq/L (ref 96–112)
Creatinine, Ser: 0.69 mg/dL (ref 0.40–1.20)
GFR: 103.23 mL/min (ref >60.00)
Glucose, Bld: 81 mg/dL (ref 70–99)
Potassium: 4.2 meq/L (ref 3.5–5.1)
Sodium: 138 meq/L (ref 135–145)
Total Bilirubin: 0.4 mg/dL (ref 0.2–1.2)
Total Protein: 6.8 g/dL (ref 6.0–8.3)

## 2023-10-09 LAB — CBC WITH DIFFERENTIAL/PLATELET
Basophils Absolute: 0.1 K/uL (ref 0.0–0.1)
Basophils Relative: 1 % (ref 0.0–3.0)
Eosinophils Absolute: 0.5 K/uL (ref 0.0–0.7)
Eosinophils Relative: 4.9 % (ref 0.0–5.0)
HCT: 39.9 % (ref 36.0–46.0)
Hemoglobin: 13.2 g/dL (ref 12.0–15.0)
Lymphocytes Relative: 31.2 % (ref 12.0–46.0)
Lymphs Abs: 2.9 K/uL (ref 0.7–4.0)
MCHC: 33 g/dL (ref 30.0–36.0)
MCV: 85.1 fl (ref 78.0–100.0)
Monocytes Absolute: 0.8 K/uL (ref 0.1–1.0)
Monocytes Relative: 8.1 % (ref 3.0–12.0)
Neutro Abs: 5.2 K/uL (ref 1.4–7.7)
Neutrophils Relative %: 54.8 % (ref 43.0–77.0)
Platelets: 234 K/uL (ref 150.0–400.0)
RBC: 4.69 Mil/uL (ref 3.87–5.11)
RDW: 15.2 % (ref 11.5–15.5)
WBC: 9.4 K/uL (ref 4.0–10.5)

## 2023-10-09 LAB — TSH: TSH: 0.99 u[IU]/mL (ref 0.35–5.50)

## 2023-10-09 LAB — VITAMIN B12: Vitamin B-12: 916 pg/mL — ABNORMAL HIGH (ref 211–911)

## 2023-10-10 ENCOUNTER — Encounter: Payer: Self-pay | Admitting: Family Medicine

## 2023-10-10 ENCOUNTER — Ambulatory Visit: Payer: Self-pay | Admitting: Family Medicine

## 2023-10-10 NOTE — Assessment & Plan Note (Signed)
 Tetanus 2025 at pharmacy 11/02/22. Covid vaccine prev done  PNA and shingles not due.  Flu encouraged.  Pap per gyn DXA not due.  Mammogram per gynecology- Dr. Estelle.  Living will d/w pt.  Husband designated if patient were incapacitated.   Diet and exercise d/w pt.   Colonoscopy 07/25/23 with 10 year f/u.

## 2023-10-10 NOTE — Assessment & Plan Note (Signed)
 Continue fluoxetine  40 mg daily with as needed Xanax  and update me as needed.  Okay for outpatient follow-up.

## 2023-10-10 NOTE — Assessment & Plan Note (Signed)
Living will d/w pt.  Husband designated if patient were incapacitated.  

## 2023-11-03 ENCOUNTER — Other Ambulatory Visit: Payer: Self-pay | Admitting: Family Medicine

## 2023-11-05 NOTE — Telephone Encounter (Signed)
 LOV: 10/08/23 NOV: nothing scheduled  Last Refill: ALPRAZolam  (XANAX ) 0.25 MG tablet 08/01/2023 90 tablets 2 refills

## 2023-11-06 NOTE — Telephone Encounter (Signed)
 Sent. Thanks.

## 2024-02-02 ENCOUNTER — Other Ambulatory Visit: Payer: Self-pay | Admitting: Family Medicine

## 2024-02-04 NOTE — Telephone Encounter (Signed)
 Last OV: 10/08/2023 Pending OV: Nothing scheduled Medication: Alprazolam  0.25 Directions: Take one tablet TID prn Last Refill: 11/06/2023 Qty: #90 with 2 refills
